# Patient Record
Sex: Male | Born: 1955 | Hispanic: No | Marital: Married | Smoking: Former smoker
Health system: Southern US, Community
[De-identification: ages and names within clinical notes are randomized; demographics above are authoritative.]

## PROBLEM LIST (undated history)

## (undated) DIAGNOSIS — F17200 Nicotine dependence, unspecified, uncomplicated: Secondary | ICD-10-CM

## (undated) DIAGNOSIS — R5381 Other malaise: Secondary | ICD-10-CM

## (undated) DIAGNOSIS — E291 Testicular hypofunction: Secondary | ICD-10-CM

## (undated) DIAGNOSIS — G4730 Sleep apnea, unspecified: Secondary | ICD-10-CM

## (undated) DIAGNOSIS — E785 Hyperlipidemia, unspecified: Secondary | ICD-10-CM

## (undated) DIAGNOSIS — J069 Acute upper respiratory infection, unspecified: Secondary | ICD-10-CM

## (undated) DIAGNOSIS — E079 Disorder of thyroid, unspecified: Secondary | ICD-10-CM

## (undated) DIAGNOSIS — Z8042 Family history of malignant neoplasm of prostate: Secondary | ICD-10-CM

## (undated) DIAGNOSIS — R5383 Other fatigue: Secondary | ICD-10-CM

## (undated) HISTORY — DX: Disorder of thyroid, unspecified: E07.9

## (undated) HISTORY — DX: Acute upper respiratory infection, unspecified: J06.9

## (undated) HISTORY — DX: Other malaise: R53.83

## (undated) HISTORY — DX: Other malaise: R53.81

## (undated) HISTORY — DX: Testicular hypofunction: E29.1

## (undated) HISTORY — PX: FINGER FRACTURE SURGERY: SHX638

## (undated) HISTORY — DX: Family history of malignant neoplasm of prostate: Z80.42

## (undated) HISTORY — DX: Nicotine dependence, unspecified, uncomplicated: F17.200

## (undated) HISTORY — PX: SHOULDER SURGERY: SHX246

## (undated) HISTORY — PX: KNEE SURGERY: SHX244

## (undated) HISTORY — DX: Sleep apnea, unspecified: G47.30

## (undated) HISTORY — DX: Hyperlipidemia, unspecified: E78.5

---

## 1998-08-21 ENCOUNTER — Other Ambulatory Visit: Admission: RE | Admit: 1998-08-21 | Discharge: 1998-08-21 | Payer: Self-pay | Admitting: Gastroenterology

## 2003-09-21 ENCOUNTER — Emergency Department (HOSPITAL_COMMUNITY): Admission: EM | Admit: 2003-09-21 | Discharge: 2003-09-21 | Payer: Self-pay | Admitting: Emergency Medicine

## 2004-08-24 ENCOUNTER — Ambulatory Visit: Payer: Self-pay | Admitting: Family Medicine

## 2004-09-02 ENCOUNTER — Ambulatory Visit: Payer: Self-pay | Admitting: Family Medicine

## 2004-10-11 ENCOUNTER — Ambulatory Visit: Payer: Self-pay | Admitting: Family Medicine

## 2005-04-29 ENCOUNTER — Ambulatory Visit: Payer: Self-pay | Admitting: Family Medicine

## 2005-05-13 ENCOUNTER — Ambulatory Visit: Payer: Self-pay | Admitting: Family Medicine

## 2005-10-14 ENCOUNTER — Ambulatory Visit: Payer: Self-pay | Admitting: Family Medicine

## 2005-11-15 ENCOUNTER — Ambulatory Visit: Payer: Self-pay | Admitting: Family Medicine

## 2006-01-09 ENCOUNTER — Ambulatory Visit: Payer: Self-pay | Admitting: Family Medicine

## 2006-08-29 ENCOUNTER — Encounter: Payer: Self-pay | Admitting: Family Medicine

## 2007-09-13 ENCOUNTER — Encounter: Payer: Self-pay | Admitting: Family Medicine

## 2007-10-15 ENCOUNTER — Ambulatory Visit: Payer: Self-pay | Admitting: Family Medicine

## 2007-10-15 DIAGNOSIS — J069 Acute upper respiratory infection, unspecified: Secondary | ICD-10-CM | POA: Insufficient documentation

## 2007-10-30 ENCOUNTER — Ambulatory Visit: Payer: Self-pay | Admitting: Family Medicine

## 2007-10-30 DIAGNOSIS — F17200 Nicotine dependence, unspecified, uncomplicated: Secondary | ICD-10-CM

## 2007-10-30 DIAGNOSIS — R5383 Other fatigue: Secondary | ICD-10-CM

## 2007-10-30 DIAGNOSIS — E785 Hyperlipidemia, unspecified: Secondary | ICD-10-CM | POA: Insufficient documentation

## 2007-10-30 DIAGNOSIS — R7989 Other specified abnormal findings of blood chemistry: Secondary | ICD-10-CM

## 2007-10-30 DIAGNOSIS — R5381 Other malaise: Secondary | ICD-10-CM

## 2007-10-31 ENCOUNTER — Ambulatory Visit: Payer: Self-pay | Admitting: Family Medicine

## 2007-10-31 LAB — CONVERTED CEMR LAB
Albumin: 4.3 g/dL (ref 3.5–5.2)
BUN: 17 mg/dL (ref 6–23)
CO2: 26 meq/L (ref 19–32)
Calcium: 9.1 mg/dL (ref 8.4–10.5)
Cholesterol: 205 mg/dL (ref 0–200)
Creatinine, Ser: 1.3 mg/dL (ref 0.4–1.5)
Direct LDL: 149.3 mg/dL
Glucose, Bld: 104 mg/dL — ABNORMAL HIGH (ref 70–99)
Total Bilirubin: 1.1 mg/dL (ref 0.3–1.2)
Total CHOL/HDL Ratio: 5
Total Protein: 6.7 g/dL (ref 6.0–8.3)

## 2007-11-08 ENCOUNTER — Ambulatory Visit: Payer: Self-pay | Admitting: Pulmonary Disease

## 2007-11-08 DIAGNOSIS — G4733 Obstructive sleep apnea (adult) (pediatric): Secondary | ICD-10-CM

## 2007-12-07 ENCOUNTER — Ambulatory Visit (HOSPITAL_BASED_OUTPATIENT_CLINIC_OR_DEPARTMENT_OTHER): Admission: RE | Admit: 2007-12-07 | Discharge: 2007-12-07 | Payer: Self-pay | Admitting: Pulmonary Disease

## 2007-12-07 ENCOUNTER — Encounter: Payer: Self-pay | Admitting: Pulmonary Disease

## 2007-12-14 ENCOUNTER — Ambulatory Visit: Payer: Self-pay | Admitting: Pulmonary Disease

## 2007-12-18 ENCOUNTER — Telehealth (INDEPENDENT_AMBULATORY_CARE_PROVIDER_SITE_OTHER): Payer: Self-pay | Admitting: *Deleted

## 2007-12-25 ENCOUNTER — Ambulatory Visit: Payer: Self-pay | Admitting: Pulmonary Disease

## 2008-01-22 ENCOUNTER — Ambulatory Visit: Payer: Self-pay | Admitting: Pulmonary Disease

## 2008-02-16 ENCOUNTER — Encounter: Payer: Self-pay | Admitting: Pulmonary Disease

## 2008-03-13 ENCOUNTER — Encounter: Payer: Self-pay | Admitting: Family Medicine

## 2008-08-13 ENCOUNTER — Ambulatory Visit (HOSPITAL_COMMUNITY): Admission: RE | Admit: 2008-08-13 | Discharge: 2008-08-13 | Payer: Self-pay | Admitting: Specialist

## 2008-10-09 ENCOUNTER — Encounter: Payer: Self-pay | Admitting: Family Medicine

## 2008-10-23 ENCOUNTER — Ambulatory Visit (HOSPITAL_COMMUNITY): Admission: RE | Admit: 2008-10-23 | Discharge: 2008-10-23 | Payer: Self-pay | Admitting: Specialist

## 2008-11-20 ENCOUNTER — Telehealth: Payer: Self-pay | Admitting: Pulmonary Disease

## 2010-09-07 NOTE — Procedures (Signed)
NAME:  Miguel Mendez, Miguel Mendez NO.:  [id number]   MEDICAL RECORD NO.:  [id number]          PATIENT TYPE:  OUT   LOCATION:  SLEEP CENTER                 FACILITY:  [facility]   PHYSICIAN:  Barbaraann Share, MD,FCCPDATE OF BIRTH:  06-Feb-1956   DATE OF STUDY:  12/07/2007                            NOCTURNAL POLYSOMNOGRAM   REFERRING PHYSICIAN:  Barbaraann Share, MD,FCCP   INDICATION FOR STUDY:  Hypersomnia or sleep apnea.   EPWORTH SLEEPINESS SCORE:  4.   SLEEP ARCHITECTURE:  The patient had a total sleep time of 317 minutes,  with no slow-wave sleep and mild decreased quantity of REM.  Sleep onset  latency was normal at 6.5 minutes, and REM onset was mildly prolonged at  117 minutes.  Sleep efficiency was decreased at 84%.   RESPIRATORY DATA:  The patient was found to have 36 apneas and 62  hypopneas, for an AHI of 19 events per hour.  He was also found to have  91 respiratory effort-related arousals, giving him a RDI of 36 events  per hour.  The events were worse in the supine position and during REM,  and moderate snoring was noted throughout.   OXYGEN DATA:  There was oxygen desaturation as low as 89% with the  patient's obstructive events.   CARDIAC DATA:  No clinically significant arrhythmias were noted.   MOVEMENT-PARASOMNIA:  The patient was found to have 277 periodic leg  movements, with 9 per hour resulting in arousal or awakening.   IMPRESSIONS-RECOMMENDATIONS:  1. 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  There was      oxygen desaturation as low as 89%.  Treatment for this degree of      sleep apnea can include weight loss alone, if applicable; upper      airway surgery, oral appliance and CPAP.  Clinical correlation is      suggested.  2. Very large numbers of periodic leg movements with significant sleep      disruption.  These may simply be due to      the patient's  sleep disordered breathing; however, I cannot exclude      a primary movement disorder of sleep.  Clinical correlation is      suggested.      Barbaraann Share, MD,FCCP  Diplomate, American Board of Sleep  Medicine  Electronically Signed     [facility]  D:  12/14/2007 18:18:09  T:  12/14/2007 20:37:14  Job:  [id number]

## 2010-11-03 ENCOUNTER — Encounter: Payer: Self-pay | Admitting: Family Medicine

## 2010-11-04 ENCOUNTER — Ambulatory Visit (INDEPENDENT_AMBULATORY_CARE_PROVIDER_SITE_OTHER): Payer: 59 | Admitting: Family Medicine

## 2010-11-04 ENCOUNTER — Telehealth: Payer: Self-pay | Admitting: *Deleted

## 2010-11-04 ENCOUNTER — Encounter: Payer: Self-pay | Admitting: Family Medicine

## 2010-11-04 VITALS — BP 122/76 | HR 74 | Temp 98.1°F | Ht 73.0 in | Wt 270.0 lb

## 2010-11-04 DIAGNOSIS — K117 Disturbances of salivary secretion: Secondary | ICD-10-CM

## 2010-11-04 DIAGNOSIS — R350 Frequency of micturition: Secondary | ICD-10-CM

## 2010-11-04 DIAGNOSIS — R682 Dry mouth, unspecified: Secondary | ICD-10-CM

## 2010-11-04 LAB — COMPREHENSIVE METABOLIC PANEL
Albumin: 4.7 g/dL (ref 3.5–5.2)
CO2: 25 mEq/L (ref 19–32)
Calcium: 8.8 mg/dL (ref 8.4–10.5)
GFR: 65.82 mL/min (ref >60.00)
Glucose, Bld: 99 mg/dL (ref 70–99)
Potassium: 4.1 mEq/L (ref 3.5–5.1)
Sodium: 142 mEq/L (ref 135–145)

## 2010-11-04 LAB — POCT URINALYSIS DIPSTICK
Glucose, UA: NEGATIVE
Ketones, UA: NEGATIVE
Leukocytes, UA: NEGATIVE

## 2010-11-04 NOTE — Telephone Encounter (Signed)
Patient was seen this morning and after talking with his urologist he was told to call and ask if a PSA could be added to the labs that were drawn this morning to prevent him from having to have his blood drawn again.

## 2010-11-04 NOTE — Telephone Encounter (Signed)
Patient was advised that we would not be able to add a PSA because the tube needed for the test was not drawn at visit this am.

## 2010-11-04 NOTE — Progress Notes (Signed)
Off testosterone.  Tired.  Thirsty and dry mouth.  Nocturia, frequency less of a problem in the day.  Mood changes (off testosterone ~8 months)- less patience than normal.  Losing weight, down about 20 lbs over 1 year but recently leveled off.  Shaky if he doesn't eat, better after a meal, going on for about 1 year.  No SI/HI.  No burning with urination.    No FCNAVD, no rash.  Ate breakfast but not lunch.    Meds, vitals, and allergies reviewed.   ROS: See HPI.  Otherwise, noncontributory.  GEN: nad, alert and oriented HEENT: mucous membranes minimally dry, tm wnl, nasal and op exam wnl o/w.  NECK: supple w/o LA CV: rrr PULM: ctab, no inc wob ABD: soft, +bs EXT: trace edema SKIN: no acute rash

## 2010-11-04 NOTE — Patient Instructions (Signed)
You can get your results through our phone system.  Follow the instructions on the blue card. Call the urology clinic about your testosterone treatment.   Take care.

## 2012-05-16 ENCOUNTER — Encounter (HOSPITAL_COMMUNITY): Payer: Self-pay | Admitting: Emergency Medicine

## 2012-05-16 ENCOUNTER — Emergency Department (HOSPITAL_COMMUNITY)
Admission: EM | Admit: 2012-05-16 | Discharge: 2012-05-16 | Disposition: A | Discharge status: Home / Self Care | Payer: 59 | Attending: Emergency Medicine | Admitting: Emergency Medicine

## 2012-05-16 ENCOUNTER — Emergency Department (HOSPITAL_COMMUNITY): Payer: 59

## 2012-05-16 DIAGNOSIS — Z79899 Other long term (current) drug therapy: Secondary | ICD-10-CM | POA: Insufficient documentation

## 2012-05-16 DIAGNOSIS — R110 Nausea: Secondary | ICD-10-CM | POA: Insufficient documentation

## 2012-05-16 DIAGNOSIS — Z8639 Personal history of other endocrine, nutritional and metabolic disease: Secondary | ICD-10-CM | POA: Insufficient documentation

## 2012-05-16 DIAGNOSIS — N201 Calculus of ureter: Secondary | ICD-10-CM

## 2012-05-16 DIAGNOSIS — Z862 Personal history of diseases of the blood and blood-forming organs and certain disorders involving the immune mechanism: Secondary | ICD-10-CM | POA: Insufficient documentation

## 2012-05-16 LAB — URINALYSIS, ROUTINE W REFLEX MICROSCOPIC
Bilirubin Urine: NEGATIVE
Ketones, ur: 15 mg/dL — AB
Nitrite: NEGATIVE
Specific Gravity, Urine: 1.03 (ref 1.005–1.030)
Urobilinogen, UA: 0.2 mg/dL (ref 0.0–1.0)
pH: 6 (ref 5.0–8.0)

## 2012-05-16 LAB — URINE MICROSCOPIC-ADD ON

## 2012-05-16 LAB — CBC WITH DIFFERENTIAL/PLATELET
Basophils Relative: 1 % (ref 0–1)
HCT: 43.4 % (ref 39.0–52.0)
Hemoglobin: 15 g/dL (ref 13.0–17.0)
Lymphs Abs: 1.8 10*3/uL (ref 0.7–4.0)
MCH: 32.9 pg (ref 26.0–34.0)
MCHC: 34.6 g/dL (ref 30.0–36.0)
Monocytes Absolute: 0.6 10*3/uL (ref 0.1–1.0)
Monocytes Relative: 9 % (ref 3–12)
Neutro Abs: 4.5 10*3/uL (ref 1.7–7.7)
RBC: 4.56 MIL/uL (ref 4.22–5.81)
WBC: 7.1 10*3/uL (ref 4.0–10.5)

## 2012-05-16 LAB — BASIC METABOLIC PANEL
CO2: 24 mEq/L (ref 19–32)
Calcium: 9.4 mg/dL (ref 8.4–10.5)
GFR calc Af Amer: 75 mL/min — ABNORMAL LOW (ref >90)
GFR calc non Af Amer: 65 mL/min — ABNORMAL LOW (ref >90)

## 2012-05-16 MED ORDER — SODIUM CHLORIDE 0.9 % IV SOLN
Freq: Once | INTRAVENOUS | Status: AC
  Administered 2012-05-16: 08:00:00 via INTRAVENOUS

## 2012-05-16 MED ORDER — ONDANSETRON HCL 4 MG/2ML IJ SOLN
4.0000 mg | Freq: Once | INTRAMUSCULAR | Status: AC
  Administered 2012-05-16: 4 mg via INTRAVENOUS
  Filled 2012-05-16: qty 2

## 2012-05-16 MED ORDER — OXYCODONE-ACETAMINOPHEN 5-325 MG PO TABS: 1.0000 | ORAL_TABLET | Freq: Four times a day (QID) | ORAL | Status: DC | PRN

## 2012-05-16 MED ORDER — FENTANYL CITRATE 0.05 MG/ML IJ SOLN
100.0000 ug | Freq: Once | INTRAMUSCULAR | Status: AC
  Administered 2012-05-16: 100 ug via INTRAVENOUS
  Filled 2012-05-16: qty 2

## 2012-05-16 MED ORDER — KETOROLAC TROMETHAMINE 30 MG/ML IJ SOLN
30.0000 mg | Freq: Once | INTRAMUSCULAR | Status: AC
  Administered 2012-05-16: 30 mg via INTRAVENOUS
  Filled 2012-05-16: qty 1

## 2012-05-16 MED ORDER — ONDANSETRON 8 MG PO TBDP: 8.0000 mg | ORAL_TABLET | Freq: Three times a day (TID) | ORAL | Status: DC | PRN

## 2012-05-16 MED ORDER — TAMSULOSIN HCL 0.4 MG PO CAPS: 0.4000 mg | ORAL_CAPSULE | Freq: Every day | ORAL | Status: DC

## 2012-05-16 NOTE — ED Provider Notes (Signed)
History     CSN: [id number]  Arrival date & time 05/16/12  0746   First MD Initiated Contact with Patient 05/16/12 0800      Chief Complaint  Patient presents with  . Flank Pain    (Consider location/radiation/quality/duration/timing/severity/associated sxs/prior treatment) Patient is a 58 y.o. male presenting with flank pain. The history is provided by the patient.  Flank Pain This is a recurrent problem. Pertinent negatives include no chest pain, no abdominal pain, no headaches and no shortness of breath.   patient developed right flank and abdominal pain this morning. It is sharp. It feels somewhat like his previous kidney stones. No dysuria. No constipation or diarrhea. No relief with bowel movement this morning. He's had nausea without vomiting. No fevers. His last kidney stone was approximately 2004. The pain is constant, but does wax and wane.  Past Medical History  Diagnosis Date  . Hyperlipidemia   . Tobacco use disorder   . Family history of malignant neoplasm of prostate   . Other testicular hypofunction   . Other malaise and fatigue   . Acute upper respiratory infections of unspecified site     History reviewed. No pertinent past surgical history.  Family History  Problem Relation Age of Onset  . Prostate cancer Father   . Coronary artery disease Father   . Prostate cancer Brother   . Coronary artery disease Maternal Grandfather   . Coronary artery disease Paternal Grandfather     in old age  . Diabetes Cousin   . Diabetes Son     History  Substance Use Topics  . Smoking status: Passive Smoke Exposure - Never Smoker    Types: Cigarettes  . Smokeless tobacco: Not on file     Comment: occasional- 1 cig several days per week  . Alcohol Use: Yes     Comment: Rare      Review of Systems  Constitutional: Negative for activity change and appetite change.  HENT: Negative for neck stiffness.   Eyes: Negative for pain.  Respiratory: Negative for chest  tightness and shortness of breath.   Cardiovascular: Negative for chest pain and leg swelling.  Gastrointestinal: Positive for nausea. Negative for vomiting, abdominal pain and diarrhea.  Genitourinary: Positive for flank pain.  Musculoskeletal: Negative for back pain.  Skin: Negative for rash.  Neurological: Negative for weakness, numbness and headaches.  Psychiatric/Behavioral: Negative for behavioral problems.    Allergies  Bee venom  Home Medications   Current Outpatient Rx  Name  Route  Sig  Dispense  Refill  . ONE-DAILY MULTI VITAMINS PO TABS   Oral   Take 1 tablet by mouth daily.           Marland Kitchen NAPROXEN SODIUM 220 MG PO TABS   Oral   Take 220 mg by mouth 2 (two) times daily as needed. For pain         . NON FORMULARY      CPAP          . ONDANSETRON 8 MG PO TBDP   Oral   Take 1 tablet (8 mg total) by mouth every 8 (eight) hours as needed for nausea.   20 tablet   0   . OXYCODONE-ACETAMINOPHEN 5-325 MG PO TABS   Oral   Take 1-2 tablets by mouth every 6 (six) hours as needed for pain.   20 tablet   0   . TAMSULOSIN HCL 0.4 MG PO CAPS   Oral   Take 1 capsule (  0.4 mg total) by mouth daily.   7 capsule   0     BP 126/82  Pulse 87  Temp 97.6 F (36.4 C) (Oral)  Resp 16  SpO2 100%  Physical Exam  Nursing note and vitals reviewed. Constitutional: He is oriented to person, place, and time. He appears well-developed and well-nourished.  HENT:  Head: Normocephalic and atraumatic.  Eyes: EOM are normal. Pupils are equal, round, and reactive to light.  Neck: Normal range of motion. Neck supple.  Cardiovascular: Normal rate, regular rhythm and normal heart sounds.   No murmur heard. Pulmonary/Chest: Effort normal and breath sounds normal.  Abdominal: Soft. Bowel sounds are normal. He exhibits no distension and no mass. There is no tenderness. There is no rebound and no guarding.  Genitourinary:       Mild tenderness to right CVA area.  Musculoskeletal:  Normal range of motion. He exhibits no edema.  Neurological: He is alert and oriented to person, place, and time. No cranial nerve deficit.  Skin: Skin is warm and dry.  Psychiatric: He has a normal mood and affect.    ED Course  Procedures (including critical care time)  Labs Reviewed  URINALYSIS, ROUTINE W REFLEX MICROSCOPIC - Abnormal; Notable for the following:    Color, Urine AMBER (*)  BIOCHEMICALS MAY BE AFFECTED BY COLOR   APPearance CLOUDY (*)     Hgb urine dipstick LARGE (*)     Ketones, ur 15 (*)     Protein, ur 30 (*)     All other components within normal limits  BASIC METABOLIC PANEL - Abnormal; Notable for the following:    Potassium 3.2 (*)     Glucose, Bld 119 (*)     GFR calc non Af Amer 65 (*)     GFR calc Af Amer 75 (*)     All other components within normal limits  CBC WITH DIFFERENTIAL  URINE MICROSCOPIC-ADD ON   Ct Abdomen Pelvis Wo Contrast  05/16/2012  *RADIOLOGY REPORT*  Clinical Data: Right flank pain  CT ABDOMEN AND PELVIS WITHOUT CONTRAST  Technique:  Multidetector CT imaging of the abdomen and pelvis was performed following the standard protocol without intravenous contrast.  Comparison: 09/21/2003  Findings: Lung bases are unremarkable.  Sagittal images of the spine shows degenerative changes lumbar spine at L1-L2 level.  Unenhanced liver shows no biliary ductal dilatation.  No calcified gallstones are noted within gallbladder.  Unenhanced pancreas, spleen and adrenal glands are unremarkable.  There is mild right hydronephrosis and minimal proximal right hydroureter.  Mild right perinephric stranding.  In axial image 38 there is 3 mm calcified calculus in proximal right ureter at the level of the upper endplate of the L3 vertebral body.  No diagnostic renal calculi are noted.  No left ureteral calculi. No aortic aneurysm.  No small bowel obstruction.  No ascites or free air.  No adenopathy.  Normal appendix partially visualized in axial image 53.  No  pericecal inflammation.  Scattered colonic diverticula left colon without evidence of acute diverticulitis. There is fluid in the lower right inguinal scrotal canal.  No calcified calculi are noted within the urinary bladder.  The urinary bladder is underdistended.  Prostate gland and  seminal vesicles are unremarkable.  No destructive bony lesions are noted within pelvis.  IMPRESSION:  1.  There is mild right hydronephrosis and minimal proximal right hydroureter.  Mild right perinephric stranding. 2.  There is 3 mm calcified calculus in proximal right ureter at  the level of the upper endplate of the L3 vertebral body. 3.  Normal appendix.  No pericecal inflammation. 4.  Scattered colonic diverticula distal colon without evidence of acute diverticulitis.   Original Report Authenticated By: Natasha Mead, M.D.      1. Right ureteral stone       MDM  Patient flank pain. 3 mm proximal ureteral stone. No infection. Pain has been improved and patient be discharged to follow with urology        Juliet Rude. Rubin Payor, MD 05/16/12 [phone]

## 2012-05-16 NOTE — ED Notes (Signed)
Right flank pain started at 7:15 this am-- hx kidney stone in 2004, feels nauseated.

## 2012-10-30 ENCOUNTER — Encounter: Payer: Self-pay | Admitting: Family Medicine

## 2012-10-30 ENCOUNTER — Ambulatory Visit (INDEPENDENT_AMBULATORY_CARE_PROVIDER_SITE_OTHER): Payer: 59 | Admitting: Family Medicine

## 2012-10-30 ENCOUNTER — Telehealth: Payer: Self-pay | Admitting: *Deleted

## 2012-10-30 ENCOUNTER — Ambulatory Visit: Payer: Self-pay | Admitting: Family Medicine

## 2012-10-30 VITALS — BP 116/82 | HR 88 | Temp 98.6°F | Ht 73.0 in | Wt 254.2 lb

## 2012-10-30 DIAGNOSIS — M7989 Other specified soft tissue disorders: Secondary | ICD-10-CM

## 2012-10-30 DIAGNOSIS — F17200 Nicotine dependence, unspecified, uncomplicated: Secondary | ICD-10-CM

## 2012-10-30 DIAGNOSIS — Z8042 Family history of malignant neoplasm of prostate: Secondary | ICD-10-CM | POA: Insufficient documentation

## 2012-10-30 DIAGNOSIS — M25561 Pain in right knee: Secondary | ICD-10-CM

## 2012-10-30 NOTE — Patient Instructions (Addendum)
Stop up front and we will schedule an ultrasound of your leg today- then will call you with a result and a plan At any time if you develop shortness of breath or chest pain - let us know or go to ER

## 2012-10-30 NOTE — Addendum Note (Signed)
Addended by: Roxy Manns A on: 10/30/2012 05:02 PM   Modules accepted: Orders

## 2012-10-30 NOTE — Assessment & Plan Note (Signed)
Disc in detail risks of smoking and possible outcomes including copd, vascular/ heart disease, cancer , respiratory and sinus infections  Pt voices understanding Pt is not ready to quit at this time  

## 2012-10-30 NOTE — Telephone Encounter (Signed)
Pt notified of [place] results and agrees with referral, I advise pt that Shirlee Limerick will call to set up appt

## 2012-10-30 NOTE — Assessment & Plan Note (Signed)
Need to r/o DVT or other clot in pt who smokes  Sent for venous duplex exam and then make plan  Of note- also having some anterior knee pain that could contribute

## 2012-10-30 NOTE — Progress Notes (Signed)
Subjective:    Patient ID: Miguel Mendez, male    DOB: 30-Sep-1955, 57 y.o.   MRN: [id number]  HPI 2 week history of swelling of R leg below the knee  Was worse early this month- could not see ankle well  Some pain in calf muscle posteriorly and also in his knee  No redness or knot , and no hx of varicose veins   No personal hx of blood clot - and does not think that runs in his family   Very little swelling in other leg   Still smokes occasionally   Continues f/u with urology for screen for prostate ca yearly   Smokes cigarettes - sometimes 1 per day / occ not - quit for 20 years in the past  If he had blood clot would consider quitting  No breathing problems   Patient Active Problem List   Diagnosis Date Noted  . Right leg swelling 10/30/2012  . Dry mouth 11/04/2010  . OBSTRUCTIVE SLEEP APNEA 11/08/2007  . HYPOGONADISM 10/30/2007  . SMOKER 10/30/2007  . Other Malaise and Fatigue 10/30/2007  . HYPERLIPIDEMIA, MILD, HX OF 10/30/2007   Past Medical History  Diagnosis Date  . Hyperlipidemia   . Tobacco use disorder   . Family history of malignant neoplasm of prostate   . Other testicular hypofunction   . Other malaise and fatigue   . Acute upper respiratory infections of unspecified site    No past surgical history on file. History  Substance Use Topics  . Smoking status: Current Some Day Smoker    Types: Cigars  . Smokeless tobacco: Not on file     Comment: occasional- 1 cig several days per week  . Alcohol Use: Yes     Comment: Rare   Family History  Problem Relation Age of Onset  . Prostate cancer Father   . Coronary artery disease Father   . Prostate cancer Brother   . Coronary artery disease Maternal Grandfather   . Coronary artery disease Paternal Grandfather     in old age  . Diabetes Cousin   . Diabetes Son    Allergies  Allergen Reactions  . Bee Venom Swelling   Current Outpatient Prescriptions on File Prior to Visit  Medication Sig Dispense  Refill  . Multiple Vitamin (MULTIVITAMIN) tablet Take 1 tablet by mouth daily.        . NON FORMULARY CPAP        No current facility-administered medications on file prior to visit.    Review of Systems Review of Systems  Constitutional: Negative for fever, appetite change, fatigue and unexpected weight change.  Eyes: Negative for pain and visual disturbance.  Respiratory: Negative for cough and shortness of breath.   Cardiovascular: Negative for cp or palpitations    Gastrointestinal: Negative for nausea, diarrhea and constipation.  Genitourinary: Negative for urgency and frequency.  Skin: Negative for pallor or rash   MSK pos for R lower leg swelling with calf soreness, neg for joint swelling or redness Neurological: Negative for weakness, light-headedness, numbness and headaches.  Hematological: Negative for adenopathy. Does not bruise/bleed easily.  Psychiatric/Behavioral: Negative for dysphoric mood. The patient is not nervous/anxious.         Objective:   Physical Exam  Constitutional: He appears well-developed and well-nourished. No distress.  overwt and well appearing   HENT:  Head: Normocephalic and atraumatic.  Eyes: Conjunctivae and EOM are normal. Pupils are equal, round, and reactive to light.  Neck: Neck supple. No  JVD present. Carotid bruit is not present. No thyromegaly present.  Cardiovascular: Normal rate and regular rhythm.  Exam reveals no gallop.   No murmur heard. One plus pedal pulses bilat  Pulmonary/Chest: Effort normal and breath sounds normal. No respiratory distress. He has no wheezes. He has no rales.  No crackles  Abdominal: Soft. Bowel sounds are normal.  Musculoskeletal: He exhibits edema and tenderness.  One plus pitting edema from knee to ankle with sock line Tenderness posterior calf without heat/redness or palp cord Pos homans' sign   Neurological: He is alert.  Skin: Skin is warm and dry. No rash noted. No erythema. No pallor.   Psychiatric: He has a normal mood and affect.          Assessment & Plan:

## 2012-10-30 NOTE — Telephone Encounter (Signed)
Thanks - let pt know no clot- good news! There is some fluid in the area and I suspect he may have had a ruptured cyst in the back of his knee - I would like to refer him to orthopedics to look at this please if agreeable  In the meantime elevate leg when you can

## 2012-10-30 NOTE — Telephone Encounter (Signed)
Miguel Mendez with [facility] ultrasound dpt called with a call report on pt.  Pt is Neg for DVT in Right leg. There was a fluid collection in his calf the largest collection was 7.8 cm, ultrasound tech wasn't sure if it was a hematoma or a ruptured bakers cyst, since there was no DVT pt was instructed to leave and we would call him with the results

## 2012-10-30 NOTE — Telephone Encounter (Signed)
Ref done  

## 2012-10-31 ENCOUNTER — Encounter: Payer: Self-pay | Admitting: Family Medicine

## 2013-02-28 ENCOUNTER — Other Ambulatory Visit: Payer: Self-pay

## 2013-10-10 ENCOUNTER — Ambulatory Visit (INDEPENDENT_AMBULATORY_CARE_PROVIDER_SITE_OTHER): Payer: 59 | Admitting: Internal Medicine

## 2013-10-10 ENCOUNTER — Encounter: Payer: Self-pay | Admitting: Internal Medicine

## 2013-10-10 VITALS — BP 118/76 | HR 88 | Temp 97.9°F | Wt 258.5 lb

## 2013-10-10 DIAGNOSIS — R209 Unspecified disturbances of skin sensation: Secondary | ICD-10-CM

## 2013-10-10 DIAGNOSIS — R220 Localized swelling, mass and lump, head: Secondary | ICD-10-CM

## 2013-10-10 DIAGNOSIS — R221 Localized swelling, mass and lump, neck: Secondary | ICD-10-CM

## 2013-10-10 DIAGNOSIS — R202 Paresthesia of skin: Secondary | ICD-10-CM

## 2013-10-10 LAB — COMPREHENSIVE METABOLIC PANEL
ALK PHOS: 50 U/L (ref 39–117)
ALT: 28 U/L (ref 0–53)
AST: 26 U/L (ref 0–37)
Albumin: 4.6 g/dL (ref 3.5–5.2)
BILIRUBIN TOTAL: 0.7 mg/dL (ref 0.2–1.2)
BUN: 12 mg/dL (ref 6–23)
CO2: 30 meq/L (ref 19–32)
Calcium: 9.6 mg/dL (ref 8.4–10.5)
Chloride: 106 mEq/L (ref 96–112)
Creatinine, Ser: 1.2 mg/dL (ref 0.4–1.5)
GFR: 64.51 mL/min (ref >60.00)
GLUCOSE: 104 mg/dL — AB (ref 70–99)
POTASSIUM: 4.4 meq/L (ref 3.5–5.1)
Sodium: 140 mEq/L (ref 135–145)
TOTAL PROTEIN: 7.2 g/dL (ref 6.0–8.3)

## 2013-10-10 LAB — TSH: TSH: 13.55 u[IU]/mL — ABNORMAL HIGH (ref 0.35–4.50)

## 2013-10-10 LAB — CBC
HCT: 44.7 % (ref 39.0–52.0)
Hemoglobin: 15.1 g/dL (ref 13.0–17.0)
MCHC: 33.8 g/dL (ref 30.0–36.0)
MCV: 94.2 fl (ref 78.0–100.0)
Platelets: 201 10*3/uL (ref 150.0–400.0)
RBC: 4.74 Mil/uL (ref 4.22–5.81)
RDW: 13.3 % (ref 11.5–15.5)
WBC: 7.1 10*3/uL (ref 4.0–10.5)

## 2013-10-10 LAB — VITAMIN B12: Vitamin B-12: 427 pg/mL (ref 211–911)

## 2013-10-10 NOTE — Progress Notes (Signed)
Subjective:    Patient ID: Miguel Mendez, male    DOB: 12/13/1955, 58 y.o.   MRN: [id number]  HPI  Pt presents to the clinic today with c/o a swelling on the front of his neck. He noticed this 1 week ago. He denies neck pain, difficulty swallowing, fever, chills or body aches. He has not had any particular injury to the area.  Additionally, he reports that he notices that his fingers turn white and start to cramp. This occurs mainly in the index finger of his right hand but also his left hand as well. It seems to be worse when it is cold outside. He has noticed some numbness and tingling as well. He denies pain or stiffness in the joints. He has not tried anything OTC.  Review of Systems      Past Medical History  Diagnosis Date  . Hyperlipidemia   . Tobacco use disorder   . Family history of malignant neoplasm of prostate   . Other testicular hypofunction   . Other malaise and fatigue   . Acute upper respiratory infections of unspecified site     Current Outpatient Prescriptions  Medication Sig Dispense Refill  . ibuprofen (ADVIL,MOTRIN) 200 MG tablet Take 200 mg by mouth as needed for pain.      . Multiple Vitamin (MULTIVITAMIN) tablet Take 1 tablet by mouth daily.        . NON FORMULARY CPAP        No current facility-administered medications for this visit.    Allergies  Allergen Reactions  . Bee Venom Swelling    Family History  Problem Relation Age of Onset  . Prostate cancer Father   . Coronary artery disease Father   . Prostate cancer Brother   . Coronary artery disease Maternal Grandfather   . Coronary artery disease Paternal Grandfather     in old age  . Diabetes Cousin   . Diabetes Son     History   Social History  . Marital Status: Married    Spouse Name: N/A    Number of Children: N/A  . Years of Education: N/A   Occupational History  . Construction    Social History Main Topics  . Smoking status: Former Smoker    Types: Cigars  .  Smokeless tobacco: Not on file     Comment: quit 03/2013  . Alcohol Use: Yes     Comment: Rare  . Drug Use: No  . Sexual Activity: Not on file   Other Topics Concern  . Not on file   Social History Narrative   Works at [employer] center.        Married; 1 child     Constitutional: Pt reports fatigue. Denies fever, fatigue, headache or abrupt weight changes.  Musculoskeletal: Pt reports cramping of the hands. Denies decrease in range of motion, difficulty with gait, muscle pain or joint pain and swelling.  Skin: Pt reports cyst on neck. Denies redness, rashes, or ulcercations.    No other specific complaints in a complete review of systems (except as listed in HPI above).  Objective:   Physical Exam   BP 118/76  Pulse 88  Temp(Src) 97.9 F (36.6 C) (Oral)  Wt 258 lb 8 oz (117.255 kg)  SpO2 98% Wt Readings from Last 3 Encounters:  10/10/13 258 lb 8 oz (117.255 kg)  10/30/12 254 lb 4 oz (115.327 kg)  11/04/10 270 lb (122.471 kg)    General: Appears his stated  age, well developed, well nourished in NAD. Skin: Warm, dry and intact. Noticeable swelling of the left superior neck adjacent to midline. No pain with palpation. Neck: Normal range of motion. Neck supple, trachea midline. No massses, lumps or thyromegaly present.  Cardiovascular: Normal rate and rhythm. S1,S2 noted.  No murmur, rubs or gallops noted. No JVD or BLE edema. No carotid bruits noted. Cap refill < 3 secs and 2+ radial pulses bilaterally. Pulmonary/Chest: Normal effort and positive vesicular breath sounds. No respiratory distress. No wheezes, rales or ronchi noted.  Musculoskeletal: Normal flexion, extension of wrist and fingers bilaterally. 5/5 hand grip bilaterally.    BMET    Component Value Date/Time   NA 144 05/16/2012 0802   K 3.2* 05/16/2012 0802   CL 107 05/16/2012 0802   CO2 24 05/16/2012 0802   GLUCOSE 119* 05/16/2012 0802   BUN 16 05/16/2012 0802   CREATININE 1.22 05/16/2012  0802   CALCIUM 9.4 05/16/2012 0802   GFRNONAA 65* 05/16/2012 0802   GFRAA 75* 05/16/2012 0802    Lipid Panel     Component Value Date/Time   CHOL 205* 10/31/2007 0951   TRIG 91 10/31/2007 0951   HDL 40.8 10/31/2007 0951   CHOLHDL 5.0 CALC 10/31/2007 0951   VLDL 18 10/31/2007 0951    CBC    Component Value Date/Time   WBC 7.1 05/16/2012 0802   RBC 4.56 05/16/2012 0802   HGB 15.0 05/16/2012 0802   HCT 43.4 05/16/2012 0802   PLT 156 05/16/2012 0802   MCV 95.2 05/16/2012 0802   MCH 32.9 05/16/2012 0802   MCHC 34.6 05/16/2012 0802   RDW 12.5 05/16/2012 0802   LYMPHSABS 1.8 05/16/2012 0802   MONOABS 0.6 05/16/2012 0802   EOSABS 0.1 05/16/2012 0802   BASOSABS 0.1 05/16/2012 0802    Hgb A1C No results found for this basename: HGBA1C        Assessment & Plan:   Swelling/mass of left neck:  Will obtain ultrasound to identify the swelling/mass  Paresthesia/Cramps of hands:  Doesn't appear to be arthritis Will start with CBC, CMET, TSH and B12  Will follow up after labs/imaging are back, RTC as needed

## 2013-10-10 NOTE — Patient Instructions (Signed)
Neck swelling

## 2013-10-10 NOTE — Progress Notes (Signed)
Pre visit review using our clinic review tool, if applicable. No additional management support is needed unless otherwise documented below in the visit note. 

## 2013-10-11 ENCOUNTER — Encounter: Payer: Self-pay | Admitting: Internal Medicine

## 2013-10-11 ENCOUNTER — Ambulatory Visit: Payer: Self-pay | Admitting: Internal Medicine

## 2013-10-11 MED ORDER — LEVOTHYROXINE SODIUM 25 MCG PO TABS: 25.0000 ug | ORAL_TABLET | Freq: Every day | ORAL | Status: DC

## 2013-10-11 NOTE — Addendum Note (Signed)
Addended by: Lurlean Nanny on: 10/11/2013 05:04 PM   Modules accepted: Orders

## 2013-10-18 ENCOUNTER — Ambulatory Visit (HOSPITAL_COMMUNITY)
Admission: RE | Admit: 2013-10-18 | Discharge: 2013-10-18 | Disposition: A | Discharge status: Home / Self Care | Payer: Worker's Compensation | Source: Ambulatory Visit | Attending: Specialist | Admitting: Specialist

## 2013-10-18 ENCOUNTER — Other Ambulatory Visit (HOSPITAL_COMMUNITY): Payer: Self-pay | Admitting: Specialist

## 2013-10-18 DIAGNOSIS — M25511 Pain in right shoulder: Secondary | ICD-10-CM

## 2013-10-18 DIAGNOSIS — Z1389 Encounter for screening for other disorder: Secondary | ICD-10-CM | POA: Insufficient documentation

## 2013-11-09 ENCOUNTER — Other Ambulatory Visit: Payer: Self-pay | Admitting: Internal Medicine

## 2013-11-21 ENCOUNTER — Ambulatory Visit (INDEPENDENT_AMBULATORY_CARE_PROVIDER_SITE_OTHER): Payer: 59 | Admitting: Internal Medicine

## 2013-11-21 ENCOUNTER — Encounter: Payer: Self-pay | Admitting: Internal Medicine

## 2013-11-21 VITALS — BP 122/76 | HR 90 | Temp 98.1°F | Wt 259.0 lb

## 2013-11-21 DIAGNOSIS — R5381 Other malaise: Secondary | ICD-10-CM

## 2013-11-21 DIAGNOSIS — R5383 Other fatigue: Secondary | ICD-10-CM

## 2013-11-21 DIAGNOSIS — R221 Localized swelling, mass and lump, neck: Secondary | ICD-10-CM

## 2013-11-21 DIAGNOSIS — E039 Hypothyroidism, unspecified: Secondary | ICD-10-CM

## 2013-11-21 DIAGNOSIS — R220 Localized swelling, mass and lump, head: Secondary | ICD-10-CM

## 2013-11-21 LAB — TSH: TSH: 8.18 u[IU]/mL — ABNORMAL HIGH (ref 0.35–4.50)

## 2013-11-21 LAB — T4, FREE: Free T4: 0.78 ng/dL (ref 0.60–1.60)

## 2013-11-21 NOTE — Patient Instructions (Signed)
Hypothyroidism The thyroid is a large gland located in the lower front of your neck. The thyroid gland helps control metabolism. Metabolism is how your body handles food. It controls metabolism with the hormone thyroxine. When this gland is underactive (hypothyroid), it produces too little hormone.  CAUSES These include:   Absence or destruction of thyroid tissue.  Goiter due to iodine deficiency.  Goiter due to medications.  Congenital defects (since birth).  Problems with the pituitary. This causes a lack of TSH (thyroid stimulating hormone). This hormone tells the thyroid to turn out more hormone. SYMPTOMS  Lethargy (feeling as though you have no energy)  Cold intolerance  Weight gain (in spite of normal food intake)  Dry skin  Coarse hair  Menstrual irregularity (if severe, may lead to infertility)  Slowing of thought processes Cardiac problems are also caused by insufficient amounts of thyroid hormone. Hypothyroidism in the newborn is cretinism, and is an extreme form. It is important that this form be treated adequately and immediately or it will lead rapidly to retarded physical and mental development. DIAGNOSIS  To prove hypothyroidism, your caregiver may do blood tests and ultrasound tests. Sometimes the signs are hidden. It may be necessary for your caregiver to watch this illness with blood tests either before or after diagnosis and treatment. TREATMENT  Low levels of thyroid hormone are increased by using synthetic thyroid hormone. This is a safe, effective treatment. It usually takes about four weeks to gain the full effects of the medication. After you have the full effect of the medication, it will generally take another four weeks for problems to leave. Your caregiver may start you on low doses. If you have had heart problems the dose may be gradually increased. It is generally not an emergency to get rapidly to normal. HOME CARE INSTRUCTIONS   Take your  medications as your caregiver suggests. Let your caregiver know of any medications you are taking or start taking. Your caregiver will help you with dosage schedules.  As your condition improves, your dosage needs may increase. It will be necessary to have continuing blood tests as suggested by your caregiver.  Report all suspected medication side effects to your caregiver. SEEK MEDICAL CARE IF: Seek medical care if you develop:  Sweating.  Tremulousness (tremors).  Anxiety.  Rapid weight loss.  Heat intolerance.  Emotional swings.  Diarrhea.  Weakness. SEEK IMMEDIATE MEDICAL CARE IF:  You develop chest pain, an irregular heart beat (palpitations), or a rapid heart beat. MAKE SURE YOU:   Understand these instructions.  Will watch your condition.  Will get help right away if you are not doing well or get worse. Document Released: 04/11/2005 Document Revised: 07/04/2011 Document Reviewed: 11/30/2007 [employer] Patient Information 2015 [employer]. This information is not intended to replace advice given to you by your health care provider. Make sure you discuss any questions you have with your health care provider.  

## 2013-11-21 NOTE — Progress Notes (Signed)
Pre visit review using our clinic review tool, if applicable. No additional management support is needed unless otherwise documented below in the visit note. 

## 2013-11-21 NOTE — Progress Notes (Signed)
Subjective:    Patient ID: Miguel Mendez, male    DOB: [birth year], 57 y.o.   MRN: [id number]  HPI  Pt presents to the clinic today to follow up abnormal TSH. His blood work 10/10/13 revealed a TSH of 13. At that time, he was started on synthroid. He is tolerating the medication well without side effects. He does still c/o of some fatigue.  He returns today to recheck the levels.  Of note, he did present to the last visit with a lump on his neck. With elevated TSH, there was concern for possible thyroid nodule. Ultrasound of the neck was normal. He reports that his wife still notices the swelling.   Review of Systems   Past Medical History  Diagnosis Date  . Hyperlipidemia   . Tobacco use disorder   . Family history of malignant neoplasm of prostate   . Other testicular hypofunction   . Other malaise and fatigue   . Acute upper respiratory infections of unspecified site     Current Outpatient Prescriptions  Medication Sig Dispense Refill  . ibuprofen (ADVIL,MOTRIN) 200 MG tablet Take 200 mg by mouth as needed for pain.      Marland Kitchen levothyroxine (SYNTHROID, LEVOTHROID) 25 MCG tablet Take 1 tablet (25 mcg total) by mouth daily before breakfast.  30 tablet  1  . Multiple Vitamin (MULTIVITAMIN) tablet Take 1 tablet by mouth daily.        . NON FORMULARY CPAP        No current facility-administered medications for this visit.    Allergies  Allergen Reactions  . Bee Venom Swelling    Family History  Problem Relation Age of Onset  . Prostate cancer Father   . Coronary artery disease Father   . Prostate cancer Brother   . Coronary artery disease Maternal Grandfather   . Coronary artery disease Paternal Grandfather     in old age  . Diabetes Cousin   . Diabetes Son     History   Social History  . Marital Status: Married    Spouse Name: N/A    Number of Children: N/A  . Years of Education: N/A   Occupational History  . Construction    Social History Main Topics  .  Smoking status: Former Smoker    Types: Cigars  . Smokeless tobacco: Not on file     Comment: quit 03/2013  . Alcohol Use: Yes     Comment: Rare  . Drug Use: No  . Sexual Activity: Not on file   Other Topics Concern  . Not on file   Social History Narrative   Works at [employer].        Married; 1 child     Constitutional: Pt reports fatigue. Denies fever, malaise, headache or abrupt weight changes.  Respiratory: Denies difficulty breathing, shortness of breath, cough or sputum production.   Cardiovascular: Denies chest pain, chest tightness, palpitations or swelling in the hands or feet.  Skin: Pt reports swelling of the neck. Denies redness, rashes, lesions or ulcercations.   No other specific complaints in a complete review of systems (except as listed in HPI above).  Objective:   Physical Exam   BP 122/76  Pulse 90  Temp(Src) 98.1 F (36.7 C) (Oral)  Wt 259 lb (117.482 kg)  SpO2 98% Wt Readings from Last 3 Encounters:  11/21/13 259 lb (117.482 kg)  10/10/13 258 lb 8 oz (117.255 kg)  10/30/12 254 lb 4 oz (115.327  kg)    General: Appears his stated age, well developed, well nourished in NAD. Skin: Warm, dry and intact. Slight swelling of the left side of the neck, no mass noted. I think this is just adipose tissue. Neck: Normal range of motion. Neck supple, trachea midline. No massses, lumps or thyromegaly present.  Cardiovascular: Normal rate and rhythm. S1,S2 noted.  No murmur, rubs or gallops noted. No JVD or BLE edema. No carotid bruits noted. Pulmonary/Chest: Normal effort and positive vesicular breath sounds. No respiratory distress. No wheezes, rales or ronchi noted.   BMET    Component Value Date/Time   NA 140 10/10/2013 0902   K 4.4 10/10/2013 0902   CL 106 10/10/2013 0902   CO2 30 10/10/2013 0902   GLUCOSE 104* 10/10/2013 0902   BUN 12 10/10/2013 0902   CREATININE 1.2 10/10/2013 0902   CALCIUM 9.6 10/10/2013 0902   GFRNONAA 65*  05/16/2012 0802   GFRAA 75* 05/16/2012 0802    Lipid Panel     Component Value Date/Time   CHOL 205* 10/31/2007 0951   TRIG 91 10/31/2007 0951   HDL 40.8 10/31/2007 0951   CHOLHDL 5.0 CALC 10/31/2007 0951   VLDL 18 10/31/2007 0951    CBC    Component Value Date/Time   WBC 7.1 10/10/2013 0902   RBC 4.74 10/10/2013 0902   HGB 15.1 10/10/2013 0902   HCT 44.7 10/10/2013 0902   PLT 201.0 10/10/2013 0902   MCV 94.2 10/10/2013 0902   MCH 32.9 05/16/2012 0802   MCHC 33.8 10/10/2013 0902   RDW 13.3 10/10/2013 0902   LYMPHSABS 1.8 05/16/2012 0802   MONOABS 0.6 05/16/2012 0802   EOSABS 0.1 05/16/2012 0802   BASOSABS 0.1 05/16/2012 0802    Hgb A1C No results found for this basename: HGBA1C        Assessment & Plan:   Fatigue:  B12 was normal at last visit Maybe TSH is not WNL-will recheck today and dose adjust if needed  Swelling of neck:  Ultrasound reviewed-normal I thinks this is likely just adipose tissue  Will let you know about lab results, RTC for next scheduled appt with PCP

## 2013-11-21 NOTE — Assessment & Plan Note (Signed)
Started on synthroid 09/2013 Will recheck TSH/T4 and dose adjust as needed

## 2013-11-22 ENCOUNTER — Other Ambulatory Visit: Payer: Self-pay

## 2013-11-22 MED ORDER — LEVOTHYROXINE SODIUM 50 MCG PO TABS: 50.0000 ug | ORAL_TABLET | Freq: Every day | ORAL | Status: DC

## 2013-11-22 NOTE — Addendum Note (Signed)
Addended by: Lurlean Nanny on: 11/22/2013 11:43 AM   Modules accepted: Orders

## 2013-12-23 ENCOUNTER — Other Ambulatory Visit: Payer: Self-pay | Admitting: Internal Medicine

## 2014-01-01 ENCOUNTER — Other Ambulatory Visit (INDEPENDENT_AMBULATORY_CARE_PROVIDER_SITE_OTHER): Payer: 59

## 2014-01-01 DIAGNOSIS — E039 Hypothyroidism, unspecified: Secondary | ICD-10-CM

## 2014-01-01 DIAGNOSIS — R221 Localized swelling, mass and lump, neck: Secondary | ICD-10-CM

## 2014-01-01 DIAGNOSIS — R5381 Other malaise: Secondary | ICD-10-CM

## 2014-01-01 DIAGNOSIS — R220 Localized swelling, mass and lump, head: Secondary | ICD-10-CM

## 2014-01-01 DIAGNOSIS — R5383 Other fatigue: Secondary | ICD-10-CM

## 2014-01-01 LAB — TSH: TSH: 5.28 u[IU]/mL — AB (ref 0.35–4.50)

## 2014-01-01 LAB — T4, FREE: FREE T4: 0.84 ng/dL (ref 0.60–1.60)

## 2014-01-03 MED ORDER — LEVOTHYROXINE SODIUM 75 MCG PO TABS: 75.0000 ug | ORAL_TABLET | Freq: Every day | ORAL | Status: DC

## 2014-01-03 NOTE — Addendum Note (Signed)
Addended by: Lurlean Nanny on: 01/03/2014 08:54 AM   Modules accepted: Orders

## 2014-01-03 NOTE — Addendum Note (Signed)
Addended by: Lurlean Nanny on: 01/03/2014 08:52 AM   Modules accepted: Orders

## 2014-01-25 ENCOUNTER — Other Ambulatory Visit: Payer: Self-pay | Admitting: Internal Medicine

## 2014-02-01 ENCOUNTER — Other Ambulatory Visit: Payer: Self-pay | Admitting: Internal Medicine

## 2014-02-04 ENCOUNTER — Other Ambulatory Visit (INDEPENDENT_AMBULATORY_CARE_PROVIDER_SITE_OTHER): Payer: 59

## 2014-02-04 DIAGNOSIS — R220 Localized swelling, mass and lump, head: Secondary | ICD-10-CM

## 2014-02-04 DIAGNOSIS — E039 Hypothyroidism, unspecified: Secondary | ICD-10-CM

## 2014-02-04 DIAGNOSIS — R5381 Other malaise: Secondary | ICD-10-CM

## 2014-02-04 DIAGNOSIS — R221 Localized swelling, mass and lump, neck: Secondary | ICD-10-CM

## 2014-02-04 DIAGNOSIS — R5383 Other fatigue: Secondary | ICD-10-CM

## 2014-02-04 LAB — T4, FREE: Free T4: 0.87 ng/dL (ref 0.60–1.60)

## 2014-02-04 LAB — TSH: TSH: 3.8 u[IU]/mL (ref 0.35–4.50)

## 2014-02-07 ENCOUNTER — Other Ambulatory Visit: Payer: Self-pay

## 2014-03-08 ENCOUNTER — Other Ambulatory Visit: Payer: Self-pay | Admitting: Internal Medicine

## 2014-05-24 ENCOUNTER — Other Ambulatory Visit: Payer: Self-pay | Admitting: Family Medicine

## 2014-06-23 ENCOUNTER — Other Ambulatory Visit: Payer: Self-pay | Admitting: Family Medicine

## 2014-08-18 ENCOUNTER — Other Ambulatory Visit: Payer: Self-pay | Admitting: *Deleted

## 2014-08-18 ENCOUNTER — Other Ambulatory Visit: Payer: Self-pay | Admitting: Family Medicine

## 2014-08-18 NOTE — Telephone Encounter (Signed)
Patient had normal TSH on 02/04/14.

## 2014-08-21 ENCOUNTER — Emergency Department
Admit: 2014-08-21 | Disposition: A | Discharge status: Home / Self Care | Payer: Self-pay | Admitting: Emergency Medicine

## 2014-09-12 ENCOUNTER — Ambulatory Visit (INDEPENDENT_AMBULATORY_CARE_PROVIDER_SITE_OTHER): Payer: 59 | Admitting: Family Medicine

## 2014-09-12 ENCOUNTER — Encounter: Payer: Self-pay | Admitting: Family Medicine

## 2014-09-12 VITALS — BP 124/72 | HR 66 | Temp 97.8°F | Wt 274.2 lb

## 2014-09-12 DIAGNOSIS — R21 Rash and other nonspecific skin eruption: Secondary | ICD-10-CM | POA: Diagnosis not present

## 2014-09-12 DIAGNOSIS — T148 Other injury of unspecified body region: Secondary | ICD-10-CM

## 2014-09-12 DIAGNOSIS — W57XXXA Bitten or stung by nonvenomous insect and other nonvenomous arthropods, initial encounter: Secondary | ICD-10-CM | POA: Diagnosis not present

## 2014-09-12 MED ORDER — DOXYCYCLINE HYCLATE 100 MG PO CAPS: 100.0000 mg | ORAL_CAPSULE | Freq: Two times a day (BID) | ORAL | Status: DC

## 2014-09-12 NOTE — Progress Notes (Signed)
BP 124/72 mmHg  Pulse 66  Temp(Src) 97.8 F (36.6 C) (Oral)  Wt 274 lb 4 oz (124.399 kg)  SpO2 96%   CC: rash after bug bite  Subjective:    Patient ID: Miguel Mendez, male    DOB: September 27, 1955, 59 y.o.   MRN: [id number]  HPI: Miguel Mendez is a 59 y.o. male presenting on 09/12/2014 for Insect Bite and Rash   Rash on L ankle noticed after he had 4 tick bites over last 2 weeks. Rash not pruritic. 2 bites on legs and 2 on back. One was on L ankle. Unsure how long ticks attached - thinks ~8 hours, not more than 24 hours. Also feeling somewhat nauseated over last 3 days associated with diarrhea. Also had R leg cramp yesterday.  No fevers/chills, joint pains, headaches. No other rash anywhere.  Relevant past medical, surgical, family and social history reviewed and updated as indicated. Interim medical history since our last visit reviewed. Allergies and medications reviewed and updated. Current Outpatient Prescriptions on File Prior to Visit  Medication Sig  . ibuprofen (ADVIL,MOTRIN) 200 MG tablet Take 200 mg by mouth as needed for pain.  Marland Kitchen levothyroxine (SYNTHROID, LEVOTHROID) 75 MCG tablet TAKE 1 TABLET BY MOUTH DAILY.  . Multiple Vitamin (MULTIVITAMIN) tablet Take 1 tablet by mouth daily.    . NON FORMULARY CPAP    No current facility-administered medications on file prior to visit.    Review of Systems Per HPI unless specifically indicated above     Objective:    BP 124/72 mmHg  Pulse 66  Temp(Src) 97.8 F (36.6 C) (Oral)  Wt 274 lb 4 oz (124.399 kg)  SpO2 96%  Wt Readings from Last 3 Encounters:  09/12/14 274 lb 4 oz (124.399 kg)  11/21/13 259 lb (117.482 kg)  10/10/13 258 lb 8 oz (117.255 kg)    Physical Exam  Constitutional: He appears well-developed and well-nourished. No distress.  HENT:  Head: Normocephalic and atraumatic.  Mouth/Throat: Oropharynx is clear and moist. No oropharyngeal exudate.  Eyes: Conjunctivae and EOM are normal. Pupils are equal,  round, and reactive to light. No scleral icterus.  Neck: Normal range of motion. Neck supple.  Cardiovascular: Normal rate, regular rhythm, normal heart sounds and intact distal pulses.   No murmur heard. Pulmonary/Chest: Effort normal and breath sounds normal. No respiratory distress. He has no wheezes. He has no rales.  Abdominal: Soft. Normal appearance and bowel sounds are normal. He exhibits no distension and no mass. There is no hepatosplenomegaly. There is no tenderness. There is no rigidity, no rebound, no guarding, no CVA tenderness and negative Murphy's sign.  Musculoskeletal: He exhibits no edema.  Skin: Skin is warm and dry. Rash noted.  nonblanching petechial rash 4cm diameter around site of tick bite L medial ankle, nonpruritic, no central clearing  Psychiatric: He has a normal mood and affect.  Nursing note and vitals reviewed.  Results for orders placed or performed in visit on 02/04/14  TSH  Result Value Ref Range   TSH 3.80 0.35 - 4.50 uIU/mL  T4, Free  Result Value Ref Range   Free T4 0.87 0.60 - 1.60 ng/dL      Assessment & Plan:   Problem List Items Addressed This Visit    Skin rash - Primary    Petechial rash around site of tick bite that developed 10d after bite. Not consistent with RMSF or lyme disease rash, but given rash + nausea after tick bites, likely prudent  to cover for tick borne illness Treat with 10d doxycycline. Discussed avoidance of tick bites. Discussed red flags to seek care, return for further evaluation.      Tick bite       Follow up plan: Return if symptoms worsen or fail to improve.

## 2014-09-12 NOTE — Patient Instructions (Addendum)
Given tick bite 2 wks ago with new rash and nausea, I do suggest starting doxycycline antibiotic for 10 days to cover any tick born illness. Watch for sunburns on this medicine.  Let us know if spreading rash, new fevers or new worsening symptoms  Tick Bite Information Ticks are insects that attach themselves to the skin and draw blood for food. There are various types of ticks. Common types include wood ticks and deer ticks. Most ticks live in shrubs and grassy areas. Ticks can climb onto your body when you make contact with leaves or grass where the tick is waiting. The most common places on the body for ticks to attach themselves are the scalp, neck, armpits, waist, and groin. Most tick bites are harmless, but sometimes ticks carry germs that cause diseases. These germs can be spread to a person during the tick's feeding process. The chance of a disease spreading through a tick bite depends on:   The type of tick.  Time of year.   How long the tick is attached.   Geographic location.  HOW CAN YOU PREVENT TICK BITES? Take these steps to help prevent tick bites when you are outdoors:  Wear protective clothing. Long sleeves and long pants are best.   Wear white clothes so you can see ticks more easily.  Tuck your pant legs into your socks.   If walking on a trail, stay in the middle of the trail to avoid brushing against bushes.  Avoid walking through areas with long grass.  Put insect repellent on all exposed skin and along boot tops, pant legs, and sleeve cuffs.   Check clothing, hair, and skin repeatedly and before going inside.   Brush off any ticks that are not attached.  Take a shower or bath as soon as possible after being outdoors.  WHAT IS THE PROPER WAY TO REMOVE A TICK? Ticks should be removed as soon as possible to help prevent diseases caused by tick bites. 1. If latex gloves are available, put them on before trying to remove a tick.  2. Using fine-point  tweezers, grasp the tick as close to the skin as possible. You may also use curved forceps or a tick removal tool. Grasp the tick as close to its head as possible. Avoid grasping the tick on its body. 3. Pull gently with steady upward pressure until the tick lets go. Do not twist the tick or jerk it suddenly. This may break off the tick's head or mouth parts. 4. Do not squeeze or crush the tick's body. This could force disease-carrying fluids from the tick into your body.  5. After the tick is removed, wash the bite area and your hands with soap and water or other disinfectant such as alcohol. 6. Apply a small amount of antiseptic cream or ointment to the bite site.  7. Wash and disinfect any instruments that were used.  Do not try to remove a tick by applying a hot match, petroleum jelly, or fingernail polish to the tick. These methods do not work and may increase the chances of disease being spread from the tick bite.  WHEN SHOULD YOU SEEK MEDICAL CARE? Contact your health care provider if you are unable to remove a tick from your skin or if a part of the tick breaks off and is stuck in the skin.  After a tick bite, you need to be aware of signs and symptoms that could be related to diseases spread by ticks. Contact your health  care provider if you develop any of the following in the days or weeks after the tick bite:  Unexplained fever.  Rash. A circular rash that appears days or weeks after the tick bite may indicate the possibility of Lyme disease. The rash may resemble a target with a bull's-eye and may occur at a different part of your body than the tick bite.  Redness and swelling in the area of the tick bite.   Tender, swollen lymph glands.   Diarrhea.   Weight loss.   Cough.   Fatigue.   Muscle, joint, or bone pain.   Abdominal pain.   Headache.   Lethargy or a change in your level of consciousness.  Difficulty walking or moving your legs.   Numbness in the  legs.   Paralysis.  Shortness of breath.   Confusion.   Repeated vomiting.  Document Released: 04/08/2000 Document Revised: 01/30/2013 Document Reviewed: 09/19/2012 [facility] Patient Information 2015 [place], [place]. This information is not intended to replace advice given to you by your health care provider. Make sure you discuss any questions you have with your health care provider.

## 2014-09-12 NOTE — Progress Notes (Signed)
Pre visit review using our clinic review tool, if applicable. No additional management support is needed unless otherwise documented below in the visit note. 

## 2014-09-12 NOTE — Assessment & Plan Note (Signed)
Petechial rash around site of tick bite that developed 10d after bite. Not consistent with RMSF or lyme disease rash, but given rash + nausea after tick bites, likely prudent to cover for tick borne illness Treat with 10d doxycycline. Discussed avoidance of tick bites. Discussed red flags to seek care, return for further evaluation.

## 2015-09-04 ENCOUNTER — Telehealth: Payer: Self-pay | Admitting: Family Medicine

## 2015-09-04 NOTE — Telephone Encounter (Signed)
No recent/future appts., please advise  

## 2015-09-04 NOTE — Telephone Encounter (Signed)
Please schedule f/u and refill until then  

## 2015-09-07 NOTE — Telephone Encounter (Signed)
Left detailed message on voicemail to schedule appt.  Refilled med for 1 month only.

## 2015-09-07 NOTE — Telephone Encounter (Signed)
Pt made appointment 5/19

## 2015-09-11 ENCOUNTER — Ambulatory Visit (INDEPENDENT_AMBULATORY_CARE_PROVIDER_SITE_OTHER): Payer: 59 | Admitting: Family Medicine

## 2015-09-11 ENCOUNTER — Encounter: Payer: Self-pay | Admitting: Family Medicine

## 2015-09-11 VITALS — BP 110/70 | HR 78 | Temp 97.6°F | Ht 73.0 in | Wt 264.5 lb

## 2015-09-11 DIAGNOSIS — R600 Localized edema: Secondary | ICD-10-CM | POA: Insufficient documentation

## 2015-09-11 DIAGNOSIS — E785 Hyperlipidemia, unspecified: Secondary | ICD-10-CM

## 2015-09-11 DIAGNOSIS — E039 Hypothyroidism, unspecified: Secondary | ICD-10-CM

## 2015-09-11 DIAGNOSIS — T148 Other injury of unspecified body region: Secondary | ICD-10-CM

## 2015-09-11 DIAGNOSIS — W57XXXA Bitten or stung by nonvenomous insect and other nonvenomous arthropods, initial encounter: Secondary | ICD-10-CM | POA: Insufficient documentation

## 2015-09-11 DIAGNOSIS — Z114 Encounter for screening for human immunodeficiency virus [HIV]: Secondary | ICD-10-CM

## 2015-09-11 DIAGNOSIS — Z8042 Family history of malignant neoplasm of prostate: Secondary | ICD-10-CM

## 2015-09-11 DIAGNOSIS — Z1159 Encounter for screening for other viral diseases: Secondary | ICD-10-CM

## 2015-09-11 LAB — LIPID PANEL
Cholesterol: 205 mg/dL — ABNORMAL HIGH (ref 0–200)
HDL: 42.8 mg/dL (ref >39.00)
LDL CALC: 136 mg/dL — AB (ref 0–99)
NONHDL: 162.02
Total CHOL/HDL Ratio: 5
Triglycerides: 128 mg/dL (ref 0.0–149.0)
VLDL: 25.6 mg/dL (ref 0.0–40.0)

## 2015-09-11 LAB — COMPREHENSIVE METABOLIC PANEL
ALK PHOS: 61 U/L (ref 39–117)
ALT: 32 U/L (ref 0–53)
AST: 26 U/L (ref 0–37)
Albumin: 4.5 g/dL (ref 3.5–5.2)
BUN: 12 mg/dL (ref 6–23)
CO2: 27 meq/L (ref 19–32)
CREATININE: 1.11 mg/dL (ref 0.40–1.50)
Calcium: 9.3 mg/dL (ref 8.4–10.5)
Chloride: 105 mEq/L (ref 96–112)
GFR: 72.13 mL/min (ref >60.00)
Glucose, Bld: 95 mg/dL (ref 70–99)
Potassium: 4.2 mEq/L (ref 3.5–5.1)
Sodium: 140 mEq/L (ref 135–145)
Total Bilirubin: 0.7 mg/dL (ref 0.2–1.2)
Total Protein: 6.9 g/dL (ref 6.0–8.3)

## 2015-09-11 LAB — TSH: TSH: 3.09 u[IU]/mL (ref 0.35–4.50)

## 2015-09-11 NOTE — Assessment & Plan Note (Signed)
Hep C screen today with labs

## 2015-09-11 NOTE — Assessment & Plan Note (Signed)
TSH today No clinical changes except for pedal edema occ  No change in exam

## 2015-09-11 NOTE — Assessment & Plan Note (Signed)
Due for annual urology appt-pt will make his own appt

## 2015-09-11 NOTE — Patient Instructions (Addendum)
Eat a healthy diet  Stay active  Keep loosing weight  Watch the spot on your ankle-keep it clean with soap and water and update me if it does not resolve  When you sit - try to elevate feet  When you stand - keep walking/moving Wear support socks if you need to  Avoid too much sodium/ processed foods and drink more water to prevent swelling  See your urologist for yearly follow up  Labs today for chemistries and thyroid and cholesterol and screening for Hep C and HIV

## 2015-09-11 NOTE — Assessment & Plan Note (Signed)
Lump on R ant ankle resembles insect bite with a scab (and some bruising) No hx of tick bite  inst to keep clean and dry Update if not resolved in 2 wk- would consider further eval

## 2015-09-11 NOTE — Assessment & Plan Note (Signed)
Screen with lab today

## 2015-09-11 NOTE — Assessment & Plan Note (Signed)
Disc goals for lipids and reasons to control them Lab today  Rev low sat fat diet in detail

## 2015-09-11 NOTE — Progress Notes (Signed)
Pre visit review using our clinic review tool, if applicable. No additional management support is needed unless otherwise documented below in the visit note. 

## 2015-09-11 NOTE — Progress Notes (Signed)
Subjective:    Patient ID: Miguel Mendez, male    DOB: [birth year], 60 y.o.   MRN: [id number]  HPI Here for f/u of chronic medical problems   Has been busy  Work place is moving  Working outdoors a lot -enjoys it   [employer] is down 10 lb with bmi of 34  Walking a lot -more exercise  Taking care of himself   Some issues with leg swelling  For a while -more than 6 mo  ? If due to his thyroid or not   No sob of cp  Has a spot on R ankle - ? Insect bite (bruise around it)   Hypothyroid  Almost never misses a dose -takes it first thing in the am  Not as tired as he was  Due for tsh   Non smoker now   BP Readings from Last 3 Encounters:  09/11/15 110/70  09/12/14 124/72  11/21/13 122/76    Is due to see urol- strong fam hx of prostate cancer  Last psa tests were stable -inc duration to yearly   Eating healthy- loves salads and vegetables occ red meat or ham  Usually eats chicken and [place]   Patient Active Problem List   Diagnosis Date Noted  . Insect bite 09/11/2015  . Pedal edema 09/11/2015  . Need for hepatitis C screening test 09/11/2015  . Screening for HIV (human immunodeficiency virus) 09/11/2015  . Hypothyroidism 11/21/2013  . Family history of prostate cancer 10/30/2012  . OBSTRUCTIVE SLEEP APNEA 11/08/2007  . HYPOGONADISM 10/30/2007  . Hyperlipidemia 10/30/2007   Past Medical History  Diagnosis Date  . Hyperlipidemia   . Tobacco use disorder   . Family history of malignant neoplasm of prostate   . Other testicular hypofunction   . Other malaise and fatigue   . Acute upper respiratory infections of unspecified site    No past surgical history on file. Social History  Substance Use Topics  . Smoking status: Former Smoker    Types: Cigars  . Smokeless tobacco: None     Comment: quit 03/2013  . Alcohol Use: 0.0 oz/week    0 Standard drinks or equivalent per week     Comment: Rare   Family History  Problem Relation Age of Onset  . Prostate  cancer Father   . Coronary artery disease Father   . Prostate cancer Brother   . Coronary artery disease Maternal Grandfather   . Coronary artery disease Paternal Grandfather     in old age  . Diabetes Cousin   . Diabetes Son    Allergies  Allergen Reactions  . Bee Venom Swelling   Current Outpatient Prescriptions on File Prior to Visit  Medication Sig Dispense Refill  . ibuprofen (ADVIL,MOTRIN) 200 MG tablet Take 200 mg by mouth as needed for pain.    Marland Kitchen levothyroxine (SYNTHROID, LEVOTHROID) 75 MCG tablet TAKE 1 TABLET BY MOUTH DAILY. 30 tablet 0  . Multiple Vitamin (MULTIVITAMIN) tablet Take 1 tablet by mouth daily.      . NON FORMULARY CPAP      No current facility-administered medications on file prior to visit.    Review of Systems Review of Systems  Constitutional: Negative for fever, appetite change, fatigue and unexpected weight change.  Eyes: Negative for pain and visual disturbance.  Respiratory: Negative for cough and shortness of breath.   Cardiovascular: Negative for cp or palpitations   pos for ankle swelling at the end of the day Gastrointestinal: Negative for  nausea, diarrhea and constipation.  Genitourinary: Negative for urgency and frequency.  Skin: Negative for pallor or rash   Neurological: Negative for weakness, light-headedness, numbness and headaches.  Hematological: Negative for adenopathy. Does not bruise/bleed easily.  Psychiatric/Behavioral: Negative for dysphoric mood. The patient is not nervous/anxious.         Objective:   Physical Exam  Constitutional: He appears well-developed and well-nourished. No distress.  obese and well appearing   HENT:  Head: Normocephalic and atraumatic.  Mouth/Throat: Oropharynx is clear and moist.  Eyes: Conjunctivae and EOM are normal. Pupils are equal, round, and reactive to light.  Neck: Normal range of motion. Neck supple. No JVD present. Carotid bruit is not present. No thyromegaly present.  Cardiovascular:  Normal rate, regular rhythm, normal heart sounds and intact distal pulses.  Exam reveals no gallop.   Pulmonary/Chest: Effort normal and breath sounds normal. No respiratory distress. He has no wheezes. He has no rales.  No crackles  Abdominal: Soft. Bowel sounds are normal. He exhibits no distension, no abdominal bruit and no mass. There is no tenderness.  Musculoskeletal: He exhibits edema. He exhibits no tenderness.  Trace pedal edema-not pitting Sock line noted   Lymphadenopathy:    He has no cervical adenopathy.  Neurological: He is alert. He has normal reflexes. No cranial nerve deficit. He exhibits normal muscle tone. Coordination normal.  Skin: Skin is warm and dry. No rash noted. No pallor.  1 cm lump on ant R ankle with tiny scab and some surrounding ecchymosis  Expressed sm amt of clear fluid-nt    Psychiatric: He has a normal mood and affect.          Assessment & Plan:   Problem List Items Addressed This Visit      Endocrine   Hypothyroidism - Primary    TSH today No clinical changes except for pedal edema occ  No change in exam       Relevant Orders   TSH     Other   Screening for HIV (human immunodeficiency virus)    Screen with lab today      Relevant Orders   HIV antibody (with reflex)   Pedal edema    Intermittent-worse with heat and standing  No cardiac symptoms  bp too low for diuretic currently  Enc wt loss Inc water and less salt/processed foods Has supp hose for prn use  Continue to follow cmet today      Relevant Orders   Comprehensive metabolic panel   Need for hepatitis C screening test    Hep C screen today with labs       Relevant Orders   Hepatitis C antibody   Insect bite    Lump on R ant ankle resembles insect bite with a scab (and some bruising) No hx of tick bite  inst to keep clean and dry Update if not resolved in 2 wk- would consider further eval      Hyperlipidemia    Disc goals for lipids and reasons to control  them Lab today  Rev low sat fat diet in detail       Relevant Orders   Lipid panel   Family history of prostate cancer    Due for annual urology appt-pt will make his own appt

## 2015-09-11 NOTE — Assessment & Plan Note (Signed)
Intermittent-worse with heat and standing  No cardiac symptoms  bp too low for diuretic currently  Enc wt loss Inc water and less salt/processed foods Has supp hose for prn use  Continue to follow cmet today

## 2015-09-12 LAB — HIV ANTIBODY (ROUTINE TESTING W REFLEX): HIV: NONREACTIVE

## 2015-09-12 LAB — HEPATITIS C ANTIBODY: HCV AB: NEGATIVE

## 2015-10-12 ENCOUNTER — Other Ambulatory Visit: Payer: Self-pay | Admitting: Family Medicine

## 2016-04-11 ENCOUNTER — Other Ambulatory Visit: Payer: Self-pay | Admitting: Family Medicine

## 2016-08-10 ENCOUNTER — Other Ambulatory Visit: Payer: Self-pay | Admitting: Family Medicine

## 2016-08-10 NOTE — Telephone Encounter (Signed)
Please schedule f/u this summer and refill until then  Labs prior if he wants  Thanks

## 2016-08-10 NOTE — Telephone Encounter (Signed)
Pt hasn't been seen in almost a year and no future appts., please advise  

## 2016-08-12 NOTE — Telephone Encounter (Signed)
appts scheduled and med refilled  

## 2016-09-25 ENCOUNTER — Telehealth: Payer: Self-pay | Admitting: Family Medicine

## 2016-09-25 DIAGNOSIS — E039 Hypothyroidism, unspecified: Secondary | ICD-10-CM

## 2016-09-25 DIAGNOSIS — E7800 Pure hypercholesterolemia, unspecified: Secondary | ICD-10-CM

## 2016-09-25 DIAGNOSIS — R600 Localized edema: Secondary | ICD-10-CM

## 2016-09-25 NOTE — Telephone Encounter (Signed)
-----   Message from Ellamae Sia sent at 09/22/2016  3:17 PM EDT ----- Regarding: Lab orders for Friday, 6.8.18 Lab orders for a f/u appt

## 2016-09-30 ENCOUNTER — Other Ambulatory Visit (INDEPENDENT_AMBULATORY_CARE_PROVIDER_SITE_OTHER): Payer: 59

## 2016-09-30 DIAGNOSIS — E039 Hypothyroidism, unspecified: Secondary | ICD-10-CM | POA: Diagnosis not present

## 2016-09-30 DIAGNOSIS — R600 Localized edema: Secondary | ICD-10-CM

## 2016-09-30 DIAGNOSIS — E7800 Pure hypercholesterolemia, unspecified: Secondary | ICD-10-CM | POA: Diagnosis not present

## 2016-09-30 LAB — COMPREHENSIVE METABOLIC PANEL WITH GFR
ALT: 24 U/L (ref 0–53)
AST: 20 U/L (ref 0–37)
Albumin: 4.3 g/dL (ref 3.5–5.2)
Alkaline Phosphatase: 47 U/L (ref 39–117)
BUN: 18 mg/dL (ref 6–23)
CO2: 30 meq/L (ref 19–32)
Calcium: 9.4 mg/dL (ref 8.4–10.5)
Chloride: 109 meq/L (ref 96–112)
Creatinine, Ser: 1.3 mg/dL (ref 0.40–1.50)
GFR: 59.89 mL/min — ABNORMAL LOW
Glucose, Bld: 110 mg/dL — ABNORMAL HIGH (ref 70–99)
Potassium: 4.2 meq/L (ref 3.5–5.1)
Sodium: 144 meq/L (ref 135–145)
Total Bilirubin: 0.6 mg/dL (ref 0.2–1.2)
Total Protein: 6.8 g/dL (ref 6.0–8.3)

## 2016-09-30 LAB — LIPID PANEL
Cholesterol: 186 mg/dL (ref 0–200)
HDL: 42.4 mg/dL
LDL Cholesterol: 122 mg/dL — ABNORMAL HIGH (ref 0–99)
NonHDL: 143.37
Total CHOL/HDL Ratio: 4
Triglycerides: 109 mg/dL (ref 0.0–149.0)
VLDL: 21.8 mg/dL (ref 0.0–40.0)

## 2016-09-30 LAB — TSH: TSH: 6.21 u[IU]/mL — ABNORMAL HIGH (ref 0.35–4.50)

## 2016-10-04 ENCOUNTER — Ambulatory Visit (INDEPENDENT_AMBULATORY_CARE_PROVIDER_SITE_OTHER): Payer: 59 | Admitting: Family Medicine

## 2016-10-04 ENCOUNTER — Encounter: Payer: Self-pay | Admitting: Family Medicine

## 2016-10-04 ENCOUNTER — Encounter: Payer: Self-pay | Admitting: Internal Medicine

## 2016-10-04 VITALS — BP 118/68 | HR 75 | Temp 98.4°F | Ht 73.0 in | Wt 257.8 lb

## 2016-10-04 DIAGNOSIS — E7800 Pure hypercholesterolemia, unspecified: Secondary | ICD-10-CM | POA: Diagnosis not present

## 2016-10-04 DIAGNOSIS — R7309 Other abnormal glucose: Secondary | ICD-10-CM | POA: Diagnosis not present

## 2016-10-04 DIAGNOSIS — Z8042 Family history of malignant neoplasm of prostate: Secondary | ICD-10-CM

## 2016-10-04 DIAGNOSIS — E66811 Obesity, class 1: Secondary | ICD-10-CM

## 2016-10-04 DIAGNOSIS — E669 Obesity, unspecified: Secondary | ICD-10-CM

## 2016-10-04 DIAGNOSIS — Z1211 Encounter for screening for malignant neoplasm of colon: Secondary | ICD-10-CM

## 2016-10-04 DIAGNOSIS — E039 Hypothyroidism, unspecified: Secondary | ICD-10-CM

## 2016-10-04 MED ORDER — LEVOTHYROXINE SODIUM 88 MCG PO TABS: 88.0000 ug | ORAL_TABLET | Freq: Every day | ORAL | 11 refills | Status: DC

## 2016-10-04 NOTE — Assessment & Plan Note (Signed)
Discussed how this problem influences overall health and the risks it imposes  Reviewed plan for weight loss with lower calorie diet (via better food choices and also portion control or program like weight watchers) and exercise building up to or more than 30 minutes 5 days per week including some aerobic activity    

## 2016-10-04 NOTE — Assessment & Plan Note (Signed)
Disc goals for lipids and reasons to control them Rev labs with pt Rev low sat fat diet in detail LDL is coming down with improved diet  Disc other bkfast options (besides sausage biscuit)

## 2016-10-04 NOTE — Progress Notes (Signed)
Subjective:    Patient ID: Miguel Mendez, male    DOB: Jun 16, 1955, 61 y.o.   MRN: [id number]  HPI Here for f/u of chronic health problems   Doing ok overall   Mother passed away this year - had dementia  Doing fine with grief   Wt Readings from Last 3 Encounters:  10/04/16 257 lb 12 oz (116.9 kg)  09/11/15 264 lb 8 oz (120 kg)  09/12/14 274 lb 4 oz (124.4 kg)  walking a lot  Down 7 lb  Eating better - loves salads  bmi 34.01   Hypothyroidism  Pt has no clinical changes No change in hair or skin/ edema and no tremor   Notes being more sluggish lately  Lab Results  Component Value Date   TSH 6.21 (H) 09/30/2016    Takes levothyrox 75 mcg  He takes it am 30 min before eating and no missed doses   Hx of hyperlipidemia Lab Results  Component Value Date   CHOL 186 09/30/2016   CHOL 205 (H) 09/11/2015   CHOL 205 (HH) 10/31/2007   Lab Results  Component Value Date   HDL 42.40 09/30/2016   HDL 42.80 09/11/2015   HDL 40.8 10/31/2007   Lab Results  Component Value Date   LDLCALC 122 (H) 09/30/2016   LDLCALC 136 (H) 09/11/2015   Lab Results  Component Value Date   TRIG 109.0 09/30/2016   TRIG 128.0 09/11/2015   TRIG 91 10/31/2007   Lab Results  Component Value Date   CHOLHDL 4 09/30/2016   CHOLHDL 5 09/11/2015   CHOLHDL 5.0 CALC 10/31/2007   Lab Results  Component Value Date   LDLDIRECT 149.3 10/31/2007   Diet controlled  It is better - and LDL is down  Trying to avoid fast food (with a few exceptions)  Does eat sausage biscuit     Chemistry      Component Value Date/Time   NA 144 09/30/2016 0846   K 4.2 09/30/2016 0846   CL 109 09/30/2016 0846   CO2 30 09/30/2016 0846   BUN 18 09/30/2016 0846   CREATININE 1.30 09/30/2016 0846      Component Value Date/Time   CALCIUM 9.4 09/30/2016 0846   ALKPHOS 47 09/30/2016 0846   AST 20 09/30/2016 0846   ALT 24 09/30/2016 0846   BILITOT 0.6 09/30/2016 0846      Glucose slt elevated at 110  Son has  diabetes and aunts and uncles  Walking regularly and also an active job  He does like sweets  He drinks juice every am    Needs ref for colonoscopy-first screening  Wants to set that up in aug if poss   Patient Active Problem List   Diagnosis Date Noted  . Colon cancer screening 10/04/2016  . Elevated glucose level 10/04/2016  . Pedal edema 09/11/2015  . Need for hepatitis C screening test 09/11/2015  . Screening for HIV (human immunodeficiency virus) 09/11/2015  . Hypothyroidism 11/21/2013  . Family history of prostate cancer 10/30/2012  . OBSTRUCTIVE SLEEP APNEA 11/08/2007  . HYPOGONADISM 10/30/2007  . Hyperlipidemia 10/30/2007   Past Medical History:  Diagnosis Date  . Acute upper respiratory infections of unspecified site   . Family history of malignant neoplasm of prostate   . Hyperlipidemia   . Other malaise and fatigue   . Other testicular hypofunction   . Tobacco use disorder    No past surgical history on file. Social History  Substance Use Topics  .  Smoking status: Former Smoker    Types: Cigars  . Smokeless tobacco: Never Used     Comment: quit 03/2013  . Alcohol use 0.0 oz/week     Comment: Rare   Family History  Problem Relation Age of Onset  . Prostate cancer Father   . Coronary artery disease Father   . Prostate cancer Brother   . Coronary artery disease Maternal Grandfather   . Coronary artery disease Paternal Grandfather        in old age  . Diabetes Cousin   . Diabetes Son    Allergies  Allergen Reactions  . Bee Venom Swelling   Current Outpatient Prescriptions on File Prior to Visit  Medication Sig Dispense Refill  . ibuprofen (ADVIL,MOTRIN) 200 MG tablet Take 200 mg by mouth as needed for pain.    . Multiple Vitamin (MULTIVITAMIN) tablet Take 1 tablet by mouth daily.      . NON FORMULARY CPAP      No current facility-administered medications on file prior to visit.     Review of Systems    Review of Systems  Constitutional:  Negative for fever, appetite change,  and unexpected weight change. pos for some fatigue  Eyes: Negative for pain and visual disturbance.  Respiratory: Negative for cough and shortness of breath.   Cardiovascular: Negative for cp or palpitations    Gastrointestinal: Negative for nausea, diarrhea and constipation.  Genitourinary: Negative for urgency and frequency.  Skin: Negative for pallor or rash   Neurological: Negative for weakness, light-headedness, numbness and headaches.  Hematological: Negative for adenopathy. Does not bruise/bleed easily.  Psychiatric/Behavioral: Negative for dysphoric mood. The patient is not nervous/anxious.      Objective:   Physical Exam  Constitutional: He appears well-developed and well-nourished. No distress.  obese and well appearing   HENT:  Head: Normocephalic and atraumatic.  Mouth/Throat: Oropharynx is clear and moist.  Eyes: Conjunctivae and EOM are normal. Pupils are equal, round, and reactive to light.  Neck: Normal range of motion. Neck supple. No JVD present. Carotid bruit is not present. No thyromegaly present.  Cardiovascular: Normal rate, regular rhythm, normal heart sounds and intact distal pulses.  Exam reveals no gallop.   Pulmonary/Chest: Effort normal and breath sounds normal. No respiratory distress. He has no wheezes. He has no rales.  No crackles  Abdominal: Soft. Bowel sounds are normal. He exhibits no distension, no abdominal bruit and no mass. There is no tenderness.  Musculoskeletal: He exhibits no edema.  Lymphadenopathy:    He has no cervical adenopathy.  Neurological: He is alert. He has normal reflexes. He displays no tremor. No cranial nerve deficit. He exhibits normal muscle tone. Coordination normal.  Skin: Skin is warm and dry. No rash noted. No pallor.  Psychiatric: He has a normal mood and affect.          Assessment & Plan:   Problem List Items Addressed This Visit      Endocrine   Hypothyroidism - Primary      TSH is up  Lab Results  Component Value Date   TSH 6.21 (H) 09/30/2016    Inc dose of levothyroxine to 88 mcg Alert if problems Re check 6 wk  Taking it correctly      Relevant Medications   levothyroxine (SYNTHROID, LEVOTHROID) 88 MCG tablet     Other   Colon cancer screening    Refer for first screening colonoscopy      Relevant Orders   Ambulatory referral to  Gastroenterology   Elevated glucose level    110 fasting  Disc risk of dm disc imp of low glycemic diet and wt loss to prevent DM2 Enc further wt loss  A1C with lab in 6 wk Handouts given on prediabetes       Family history of prostate cancer    Reminded him to f/u with urology      Hyperlipidemia    Disc goals for lipids and reasons to control them Rev labs with pt Rev low sat fat diet in detail LDL is coming down with improved diet  Disc other bkfast options (besides sausage biscuit)        Obesity (BMI 30.0-34.9)    Discussed how this problem influences overall health and the risks it imposes  Reviewed plan for weight loss with lower calorie diet (via better food choices and also portion control or program like weight watchers) and exercise building up to or more than 30 minutes 5 days per week including some aerobic activity

## 2016-10-04 NOTE — Assessment & Plan Note (Addendum)
110 fasting  Disc risk of dm disc imp of low glycemic diet and wt loss to prevent DM2 Enc further wt loss  A1C with lab in 6 wk Handouts given on prediabetes

## 2016-10-04 NOTE — Assessment & Plan Note (Signed)
Refer for first screening colonoscopy

## 2016-10-04 NOTE — Patient Instructions (Addendum)
For cholesterol  Avoid red meat/ fried foods/ egg yolks/ fatty breakfast meats/ butter, cheese and high fat dairy/ and shellfish    Get most of your carbohydrates from produce instead of bread/pasta/rice/crackers (processed carbs)  Shop the outside of the supermarket instead of the middle   Keep walking  Increase thyroid dose to 88 mcg - will re check 6 weeks with a blood sugar test also   Don't forget to get to the urologist for annual visit   We will refer you for screening colonoscopy

## 2016-10-04 NOTE — Assessment & Plan Note (Signed)
Reminded him to f/u with urology

## 2016-10-04 NOTE — Assessment & Plan Note (Signed)
TSH is up  Lab Results  Component Value Date   TSH 6.21 (H) 09/30/2016    Inc dose of levothyroxine to 88 mcg Alert if problems Re check 6 wk  Taking it correctly

## 2016-10-14 ENCOUNTER — Other Ambulatory Visit: Payer: Self-pay | Admitting: Family Medicine

## 2016-10-14 MED ORDER — LEVOTHYROXINE SODIUM 88 MCG PO TABS: 88.0000 ug | ORAL_TABLET | Freq: Every day | ORAL | 5 refills | Status: DC

## 2016-11-21 ENCOUNTER — Encounter: Payer: 59 | Admitting: Family Medicine

## 2016-12-12 ENCOUNTER — Encounter: Payer: Self-pay | Admitting: Internal Medicine

## 2016-12-14 DIAGNOSIS — E291 Testicular hypofunction: Secondary | ICD-10-CM | POA: Diagnosis not present

## 2016-12-14 DIAGNOSIS — N400 Enlarged prostate without lower urinary tract symptoms: Secondary | ICD-10-CM | POA: Diagnosis not present

## 2016-12-28 DIAGNOSIS — M1712 Unilateral primary osteoarthritis, left knee: Secondary | ICD-10-CM | POA: Diagnosis not present

## 2016-12-28 DIAGNOSIS — M25562 Pain in left knee: Secondary | ICD-10-CM | POA: Diagnosis not present

## 2016-12-28 DIAGNOSIS — G8929 Other chronic pain: Secondary | ICD-10-CM | POA: Diagnosis not present

## 2017-01-02 ENCOUNTER — Ambulatory Visit (AMBULATORY_SURGERY_CENTER): Payer: Self-pay | Admitting: *Deleted

## 2017-01-02 VITALS — Ht 73.0 in | Wt 257.0 lb

## 2017-01-02 DIAGNOSIS — Z1211 Encounter for screening for malignant neoplasm of colon: Secondary | ICD-10-CM

## 2017-01-02 MED ORDER — NA SULFATE-K SULFATE-MG SULF 17.5-3.13-1.6 GM/177ML PO SOLN: ORAL | 0 refills | Status: DC

## 2017-01-02 NOTE — Progress Notes (Signed)
Patient denies any allergies to eggs or soy. Patient denies any problems with anesthesia/sedation. Patient denies any oxygen use at home and does not take any diet/weight loss medications. EMMI education assisgned to patient on colonoscopy, this was explained and instructions given to patient. 

## 2017-01-04 ENCOUNTER — Encounter: Payer: Self-pay | Admitting: Internal Medicine

## 2017-01-04 ENCOUNTER — Telehealth: Payer: Self-pay | Admitting: Internal Medicine

## 2017-01-04 NOTE — Telephone Encounter (Signed)
Faxed $15 off coupon to patient's pharmacy. Wife notified.

## 2017-01-10 NOTE — Telephone Encounter (Signed)
Pt wife calling back to state coupon did not drop the price of prep and need something else.

## 2017-01-10 NOTE — Telephone Encounter (Signed)
Spoke with pt. He states the coupon only dropped the Suprep to $50.00 and they can not afford this! The pt will come to the 4th floor today and pick up a [employer] , Lot #[id number], Exp 7/20. He will call back if he has further questions. Gwyndolyn Saxon

## 2017-01-16 ENCOUNTER — Ambulatory Visit (AMBULATORY_SURGERY_CENTER): Payer: 59 | Admitting: Internal Medicine

## 2017-01-16 ENCOUNTER — Encounter: Payer: Self-pay | Admitting: Internal Medicine

## 2017-01-16 VITALS — BP 111/72 | HR 69 | Temp 98.7°F | Resp 20 | Ht 73.0 in | Wt 257.0 lb

## 2017-01-16 DIAGNOSIS — D128 Benign neoplasm of rectum: Secondary | ICD-10-CM | POA: Diagnosis not present

## 2017-01-16 DIAGNOSIS — Z1211 Encounter for screening for malignant neoplasm of colon: Secondary | ICD-10-CM

## 2017-01-16 DIAGNOSIS — D120 Benign neoplasm of cecum: Secondary | ICD-10-CM | POA: Diagnosis not present

## 2017-01-16 DIAGNOSIS — D122 Benign neoplasm of ascending colon: Secondary | ICD-10-CM | POA: Diagnosis not present

## 2017-01-16 DIAGNOSIS — D129 Benign neoplasm of anus and anal canal: Secondary | ICD-10-CM

## 2017-01-16 MED ORDER — SODIUM CHLORIDE 0.9 % IV SOLN: 500.0000 mL | INTRAVENOUS | Status: DC

## 2017-01-16 NOTE — Progress Notes (Signed)
Report to PACU, RN, vss, BBS= Clear.  

## 2017-01-16 NOTE — Op Note (Signed)
[facility] Patient Name: Miguel Mendez Procedure Date: 01/16/2017 2:07 PM MRN: [id number] Endoscopist: Jerene Bears , MD Age: 61 Referring MD:  Date of Birth: November 18, 1955 Gender: Male Account #: [id number] Procedure:                Colonoscopy Indications:              Screening for colorectal malignant neoplasm, This                            is the patient's first colonoscopy Medicines:                Monitored Anesthesia Care Procedure:                Pre-Anesthesia Assessment:                           - Prior to the procedure, a History and Physical                            was performed, and patient medications and                            allergies were reviewed. The patient's tolerance of                            previous anesthesia was also reviewed. The risks                            and benefits of the procedure and the sedation                            options and risks were discussed with the patient.                            All questions were answered, and informed consent                            was obtained. Prior Anticoagulants: The patient has                            taken no previous anticoagulant or antiplatelet                            agents. ASA Grade Assessment: II - A patient with                            mild systemic disease. After reviewing the risks                            and benefits, the patient was deemed in                            satisfactory condition to undergo the procedure.  After obtaining informed consent, the colonoscope                            was passed under direct vision. Throughout the                            procedure, the patient's blood pressure, pulse, and                            oxygen saturations were monitored continuously. The                            Colonoscope was introduced through the anus and                            advanced to the the cecum,  identified by                            appendiceal orifice and ileocecal valve. The                            colonoscopy was performed without difficulty. The                            patient tolerated the procedure well. The quality                            of the bowel preparation was fair clearing to                            adequate with copious irrigation and lavage. The                            ileocecal valve, appendiceal orifice, and rectum                            were photographed. Scope In: 2:17:00 PM Scope Out: 2:35:07 PM Scope Withdrawal Time: 0 hours 16 minutes 24 seconds  Total Procedure Duration: 0 hours 18 minutes 7 seconds  Findings:                 The digital rectal exam was normal.                           A 2 mm polyp was found in the cecum. The polyp was                            sessile. The polyp was removed with a cold biopsy                            forceps. Resection and retrieval were complete.                           Three sessile polyps were found in the ascending  colon. The polyps were 3 to 7 mm in size. These                            polyps were removed with a cold snare. Resection                            and retrieval were complete.                           A 6 mm polyp was found in the rectum. The polyp was                            sessile. The polyp was removed with a cold snare.                            Resection and retrieval were complete.                           Multiple small and large-mouthed diverticula were                            found in the sigmoid colon and descending colon.                           Internal hemorrhoids were found during                            retroflexion. The hemorrhoids were small. Complications:            No immediate complications. Estimated Blood Loss:     Estimated blood loss was minimal. Impression:               - One 2 mm polyp in the cecum,  removed with a cold                            biopsy forceps. Resected and retrieved.                           - Three 3 to 7 mm polyps in the ascending colon,                            removed with a cold snare. Resected and retrieved.                           - One 6 mm polyp in the rectum, removed with a cold                            snare. Resected and retrieved.                           - Moderate diverticulosis in the sigmoid colon and  in the descending colon.                           - Internal hemorrhoids. Recommendation:           - Patient has a contact number available for                            emergencies. The signs and symptoms of potential                            delayed complications were discussed with the                            patient. Return to normal activities tomorrow.                            Written discharge instructions were provided to the                            patient.                           - Resume previous diet.                           - Continue present medications.                           - Await pathology results.                           - Repeat colonoscopy is recommended. The                            colonoscopy date will be determined after pathology                            results from today's exam become available for                            review. Jerene Bears, MD 01/16/2017 2:39:48 PM This report has been signed electronically.

## 2017-01-16 NOTE — Patient Instructions (Signed)
**   Handouts given on polyps, diverticulosis, and hemorrhoids **   YOU HAD AN ENDOSCOPIC PROCEDURE TODAY AT THE [facility]:   Refer to the procedure report that was given to you for any specific questions about what was found during the examination.  If the procedure report does not answer your questions, please call your gastroenterologist to clarify.  If you requested that your care partner not be given the details of your procedure findings, then the procedure report has been included in a sealed envelope for you to review at your convenience later.  YOU SHOULD EXPECT: Some feelings of bloating in the abdomen. Passage of more gas than usual.  Walking can help get rid of the air that was put into your GI tract during the procedure and reduce the bloating. If you had a lower endoscopy (such as a colonoscopy or flexible sigmoidoscopy) you may notice spotting of blood in your stool or on the toilet paper. If you underwent a bowel prep for your procedure, you may not have a normal bowel movement for a few days.  Please Note:  You might notice some irritation and congestion in your nose or some drainage.  This is from the oxygen used during your procedure.  There is no need for concern and it should clear up in a day or so.  SYMPTOMS TO REPORT IMMEDIATELY:   Following lower endoscopy (colonoscopy or flexible sigmoidoscopy):  Excessive amounts of blood in the stool  Significant tenderness or worsening of abdominal pains  Swelling of the abdomen that is new, acute  Fever of 100F or higher  For urgent or emergent issues, a gastroenterologist can be reached at any hour by calling [phone].   DIET:  We do recommend a small meal at first, but then you may proceed to your regular diet.  Drink plenty of fluids but you should avoid alcoholic beverages for 24 hours.  ACTIVITY:  You should plan to take it easy for the rest of today and you should NOT DRIVE or use heavy machinery until  tomorrow (because of the sedation medicines used during the test).    FOLLOW UP: Our staff will call the number listed on your records the next business day following your procedure to check on you and address any questions or concerns that you may have regarding the information given to you following your procedure. If we do not reach you, we will leave a message.  However, if you are feeling well and you are not experiencing any problems, there is no need to return our call.  We will assume that you have returned to your regular daily activities without incident.  If any biopsies were taken you will be contacted by phone or by letter within the next 1-3 weeks.  Please call us at [phone] if you have not heard about the biopsies in 3 weeks.    SIGNATURES/CONFIDENTIALITY: You and/or your care partner have signed paperwork which will be entered into your electronic medical record.  These signatures attest to the fact that that the information above on your After Visit Summary has been reviewed and is understood.  Full responsibility of the confidentiality of this discharge information lies with you and/or your care-partner. 

## 2017-01-16 NOTE — Progress Notes (Signed)
Called to room to assist during endoscopic procedure.  Patient ID and intended procedure confirmed with present staff. Received instructions for my participation in the procedure from the performing physician.  

## 2017-01-17 ENCOUNTER — Telehealth: Payer: Self-pay

## 2017-01-17 ENCOUNTER — Telehealth: Payer: Self-pay | Admitting: *Deleted

## 2017-01-17 NOTE — Telephone Encounter (Signed)
Called [phone] and left a messaged we tried to reach pt for a follow up call. maw

## 2017-01-17 NOTE — Telephone Encounter (Signed)
  Follow up Call-  Call back number 01/16/2017  Post procedure Call Back phone  # [phone]  Permission to leave phone message Yes  Some recent data might be hidden     Patient questions:  Do you have a fever, pain , or abdominal swelling? No. Pain Score  0 *  Have you tolerated food without any problems? Yes.    Have you been able to return to your normal activities? Yes.    Do you have any questions about your discharge instructions: Diet   No. Medications  No. Follow up visit  No.  Do you have questions or concerns about your Care? No.  Actions: * If pain score is 4 or above: No action needed, pain <4.

## 2017-01-19 ENCOUNTER — Encounter: Payer: Self-pay | Admitting: Internal Medicine

## 2017-02-08 DIAGNOSIS — M1712 Unilateral primary osteoarthritis, left knee: Secondary | ICD-10-CM | POA: Diagnosis not present

## 2017-02-18 DIAGNOSIS — Z23 Encounter for immunization: Secondary | ICD-10-CM | POA: Diagnosis not present

## 2017-05-05 ENCOUNTER — Other Ambulatory Visit: Payer: Self-pay | Admitting: Family Medicine

## 2017-05-05 NOTE — Telephone Encounter (Signed)
Left VM requesting pt to call the office back and schedule a lab appt to recheck TSH, CRM created

## 2017-05-05 NOTE — Telephone Encounter (Signed)
TSH level was elevated last check in June and it doesn't look like pt has had any recent labs since then, and no future appts., please advise

## 2017-05-05 NOTE — Telephone Encounter (Signed)
He needs TSH because we changed dose in June  Please schedule that and refill enough to get by Thanks

## 2017-05-09 ENCOUNTER — Other Ambulatory Visit (INDEPENDENT_AMBULATORY_CARE_PROVIDER_SITE_OTHER): Payer: 59

## 2017-05-09 DIAGNOSIS — R7309 Other abnormal glucose: Secondary | ICD-10-CM

## 2017-05-09 DIAGNOSIS — E039 Hypothyroidism, unspecified: Secondary | ICD-10-CM

## 2017-05-09 LAB — TSH: TSH: 3.16 u[IU]/mL (ref 0.35–4.50)

## 2017-05-09 LAB — HEMOGLOBIN A1C: HEMOGLOBIN A1C: 6.1 % (ref 4.6–6.5)

## 2017-05-09 NOTE — Telephone Encounter (Signed)
Pt had TSH checked today and it was normal, Rx refilled

## 2017-11-12 ENCOUNTER — Other Ambulatory Visit: Payer: Self-pay | Admitting: Family Medicine

## 2018-01-11 ENCOUNTER — Other Ambulatory Visit: Payer: Self-pay | Admitting: Family Medicine

## 2018-03-04 DIAGNOSIS — Z23 Encounter for immunization: Secondary | ICD-10-CM | POA: Diagnosis not present

## 2018-06-01 ENCOUNTER — Other Ambulatory Visit: Payer: Self-pay | Admitting: Family Medicine

## 2018-06-01 NOTE — Telephone Encounter (Signed)
Med refilled once and pt scheduled appt with Morey Hummingbird

## 2018-06-01 NOTE — Telephone Encounter (Signed)
Pt hasn't been seen in over a year and no future appt., please advise  

## 2018-06-01 NOTE — Telephone Encounter (Signed)
Please schedule f/u and refill until then  

## 2018-06-05 ENCOUNTER — Ambulatory Visit: Payer: 59 | Admitting: Family Medicine

## 2018-06-05 ENCOUNTER — Encounter: Payer: Self-pay | Admitting: Family Medicine

## 2018-06-05 VITALS — BP 122/78 | HR 71 | Temp 98.2°F | Ht 73.0 in | Wt 262.5 lb

## 2018-06-05 DIAGNOSIS — E039 Hypothyroidism, unspecified: Secondary | ICD-10-CM

## 2018-06-05 DIAGNOSIS — R7989 Other specified abnormal findings of blood chemistry: Secondary | ICD-10-CM | POA: Diagnosis not present

## 2018-06-05 DIAGNOSIS — E669 Obesity, unspecified: Secondary | ICD-10-CM | POA: Diagnosis not present

## 2018-06-05 DIAGNOSIS — E7800 Pure hypercholesterolemia, unspecified: Secondary | ICD-10-CM | POA: Diagnosis not present

## 2018-06-05 DIAGNOSIS — R7309 Other abnormal glucose: Secondary | ICD-10-CM

## 2018-06-05 DIAGNOSIS — Z8042 Family history of malignant neoplasm of prostate: Secondary | ICD-10-CM

## 2018-06-05 LAB — COMPREHENSIVE METABOLIC PANEL
ALBUMIN: 4.3 g/dL (ref 3.5–5.2)
ALT: 26 U/L (ref 0–53)
AST: 24 U/L (ref 0–37)
Alkaline Phosphatase: 63 U/L (ref 39–117)
BILIRUBIN TOTAL: 0.5 mg/dL (ref 0.2–1.2)
BUN: 17 mg/dL (ref 6–23)
CHLORIDE: 104 meq/L (ref 96–112)
CO2: 26 mEq/L (ref 19–32)
CREATININE: 1.21 mg/dL (ref 0.40–1.50)
Calcium: 9.1 mg/dL (ref 8.4–10.5)
GFR: 60.87 mL/min (ref >60.00)
Glucose, Bld: 92 mg/dL (ref 70–99)
Potassium: 4.4 mEq/L (ref 3.5–5.1)
SODIUM: 140 meq/L (ref 135–145)
Total Protein: 6.9 g/dL (ref 6.0–8.3)

## 2018-06-05 LAB — LIPID PANEL
CHOLESTEROL: 171 mg/dL (ref 0–200)
HDL: 43.8 mg/dL (ref >39.00)
LDL Cholesterol: 109 mg/dL — ABNORMAL HIGH (ref 0–99)
NonHDL: 127.04
TRIGLYCERIDES: 92 mg/dL (ref 0.0–149.0)
Total CHOL/HDL Ratio: 4
VLDL: 18.4 mg/dL (ref 0.0–40.0)

## 2018-06-05 LAB — TSH: TSH: 2.69 u[IU]/mL (ref 0.35–4.50)

## 2018-06-05 LAB — HEMOGLOBIN A1C: Hgb A1c MFr Bld: 6 % (ref 4.6–6.5)

## 2018-06-05 NOTE — Assessment & Plan Note (Signed)
Disc goals for lipids and reasons to control them Rev last labs with pt Rev low sat fat diet in detail Lipid panel today  Warned re: biscuits in diet

## 2018-06-05 NOTE — Assessment & Plan Note (Signed)
Overdue for urology visit  Ref done Will call pt to schedule No prostate symptoms

## 2018-06-05 NOTE — Progress Notes (Signed)
Subjective:    Patient ID: Miguel Mendez, male    DOB: 11/04/1955, 63 y.o.   MRN: [id number]  HPI  Here for f/u of hypothyroidism  Has been feeling pretty good  Working a lot   Taking care of himself   Wt Readings from Last 3 Encounters:  06/05/18 262 lb 8 oz (119.1 kg)  01/16/17 257 lb (116.6 kg)  01/02/17 257 lb (116.6 kg)  exercise - walks all day at work  Diet - eating healthy / avoids junk most of the time  34.63 kg/m   Hypothyroidism  Pt has no clinical changes No change in energy level/ hair or skin/ edema and no tremor Feels ok  Lab Results  Component Value Date   TSH 3.16 05/09/2017    Takes levothy 88 mcg daily   At one time glucose was 110 fasting Will add A1C today   Has family hx of prostate cancer  Was due in aug for f/u  Needs [place] to make appt  Nocturia 0-1 time  No trouble with flow or stream   Now father has bladder cancer as well    H/o hyperlipidemia Lab Results  Component Value Date   CHOL 186 09/30/2016   HDL 42.40 09/30/2016   LDLCALC 122 (H) 09/30/2016   LDLDIRECT 149.3 10/31/2007   TRIG 109.0 09/30/2016   CHOLHDL 4 09/30/2016   Doing well to avoid high cholesterol foods  occ biscuit - ? If lard based  No sausage or bacon occ hamburger   Patient Active Problem List   Diagnosis Date Noted  . Colon cancer screening 10/04/2016  . Elevated glucose level 10/04/2016  . Obesity (BMI 30.0-34.9) 10/04/2016  . Hypothyroidism 11/21/2013  . Family history of prostate cancer 10/30/2012  . OBSTRUCTIVE SLEEP APNEA 11/08/2007  . Low testosterone 10/30/2007  . Hyperlipidemia 10/30/2007   Past Medical History:  Diagnosis Date  . Acute upper respiratory infections of unspecified site   . Family history of malignant neoplasm of prostate   . Hyperlipidemia   . Other malaise and fatigue   . Other testicular hypofunction   . Sleep apnea    CPAP use  . Thyroid disease   . Tobacco use disorder    Past Surgical History:  Procedure  Laterality Date  . FINGER FRACTURE SURGERY Left    middle  . KNEE SURGERY Right   . SHOULDER SURGERY Bilateral    Social History   Tobacco Use  . Smoking status: Former Smoker    Types: Cigars  . Smokeless tobacco: Never Used  . Tobacco comment: quit 03/2013  Substance Use Topics  . Alcohol use: Yes    Alcohol/week: 0.0 standard drinks    Comment: Rare  . Drug use: No   Family History  Problem Relation Age of Onset  . Prostate cancer Father   . Coronary artery disease Father   . Prostate cancer Brother   . Coronary artery disease Maternal Grandfather   . Coronary artery disease Paternal Grandfather        in old age  . Diabetes Cousin   . Diabetes Son   . Colon cancer Paternal Uncle 56   Allergies  Allergen Reactions  . Bee Venom Swelling   Current Outpatient Medications on File Prior to Visit  Medication Sig Dispense Refill  . ibuprofen (ADVIL,MOTRIN) 200 MG tablet Take 200 mg by mouth as needed for pain.    Marland Kitchen levothyroxine (SYNTHROID, LEVOTHROID) 88 MCG tablet TAKE ONE TABLET BY MOUTH DAILY 30  tablet 0  . Multiple Vitamin (MULTIVITAMIN) tablet Take 1 tablet by mouth daily.      . NON FORMULARY CPAP      No current facility-administered medications on file prior to visit.     Review of Systems  Constitutional: Negative for activity change, appetite change, fatigue, fever and unexpected weight change.  HENT: Negative for congestion, rhinorrhea, sore throat and trouble swallowing.   Eyes: Negative for pain, redness, itching and visual disturbance.  Respiratory: Negative for cough, chest tightness, shortness of breath and wheezing.   Cardiovascular: Negative for chest pain and palpitations.  Gastrointestinal: Negative for abdominal pain, blood in stool, constipation, diarrhea and nausea.  Endocrine: Negative for cold intolerance, heat intolerance, polydipsia and polyuria.  Genitourinary: Negative for difficulty urinating, dysuria, frequency and urgency.    Musculoskeletal: Negative for arthralgias, joint swelling and myalgias.  Skin: Negative for pallor and rash.  Neurological: Negative for dizziness, tremors, weakness, numbness and headaches.  Hematological: Negative for adenopathy. Does not bruise/bleed easily.  Psychiatric/Behavioral: Negative for decreased concentration and dysphoric mood. The patient is not nervous/anxious.        Objective:   Physical Exam Constitutional:      General: He is not in acute distress.    Appearance: He is well-developed. He is obese. He is not ill-appearing.  HENT:     Head: Normocephalic and atraumatic.     Mouth/Throat:     Mouth: Mucous membranes are moist.     Pharynx: Oropharynx is clear.  Eyes:     General: No scleral icterus.    Conjunctiva/sclera: Conjunctivae normal.     Pupils: Pupils are equal, round, and reactive to light.  Neck:     Musculoskeletal: Normal range of motion and neck supple.     Thyroid: No thyromegaly.     Vascular: No carotid bruit or JVD.     Comments: No goiter Cardiovascular:     Rate and Rhythm: Normal rate and regular rhythm.     Heart sounds: Normal heart sounds. No murmur. No gallop.   Pulmonary:     Effort: Pulmonary effort is normal. No respiratory distress.     Breath sounds: Normal breath sounds. No wheezing or rales.     Comments: Good air exch Abdominal:     General: Abdomen is flat. There is no distension or abdominal bruit.     Palpations: Abdomen is soft.  Musculoskeletal:     Right lower leg: No edema.     Left lower leg: No edema.  Lymphadenopathy:     Cervical: No cervical adenopathy.  Skin:    General: Skin is warm and dry.     Coloration: Skin is not pale.     Findings: No rash.     Comments: Solar lentigines diffusely   Neurological:     General: No focal deficit present.     Mental Status: He is alert.     Motor: No tremor.     Deep Tendon Reflexes: Reflexes are normal and symmetric.     Comments: Nl DTRs  Psychiatric:         Mood and Affect: Mood normal.     Comments: Pleasant            Assessment & Plan:   Problem List Items Addressed This Visit      Endocrine   Hypothyroidism - Primary    Hypothyroidism  Pt has no clinical changes No change in energy level/ hair or skin/ edema and no tremor Lab Results  Component Value Date   TSH 3.16 05/09/2017    Due for TSH today -will adjust as needed       Relevant Orders   TSH     Other   Low testosterone    Due for urology f/u      Relevant Orders   Ambulatory referral to Urology   Hyperlipidemia    Disc goals for lipids and reasons to control them Rev last labs with pt Rev low sat fat diet in detail Lipid panel today  Warned re: biscuits in diet        Relevant Orders   Lipid panel   Comprehensive metabolic panel   Family history of prostate cancer    Overdue for urology visit  Ref done Will call pt to schedule No prostate symptoms       Relevant Orders   Ambulatory referral to Urology   Elevated glucose level    A1C today with labs 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

## 2018-06-05 NOTE — Patient Instructions (Addendum)
For cholesterol Avoid red meat/ fried foods/ egg yolks/ fatty breakfast meats/ butter, cheese and high fat dairy/ and shellfish    Our office will call you to set up your urology follow up   Glad you are doing well   Stay active and eat healthy

## 2018-06-05 NOTE — Assessment & Plan Note (Signed)
Discussed how this problem influences overall health and the risks it imposes  Reviewed plan for weight loss with lower calorie diet (via better food choices and also portion control or program like weight watchers) and exercise building up to or more than 30 minutes 5 days per week including some aerobic activity    

## 2018-06-05 NOTE — Assessment & Plan Note (Signed)
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

## 2018-06-05 NOTE — Assessment & Plan Note (Signed)
Hypothyroidism  Pt has no clinical changes No change in energy level/ hair or skin/ edema and no tremor Lab Results  Component Value Date   TSH 3.16 05/09/2017    Due for TSH today -will adjust as needed

## 2018-06-05 NOTE — Assessment & Plan Note (Signed)
Due for urology f/u

## 2018-06-06 ENCOUNTER — Other Ambulatory Visit: Payer: Self-pay | Admitting: *Deleted

## 2018-06-06 MED ORDER — LEVOTHYROXINE SODIUM 88 MCG PO TABS: 88.0000 ug | ORAL_TABLET | Freq: Every day | ORAL | 3 refills | Status: DC

## 2018-06-27 DIAGNOSIS — E291 Testicular hypofunction: Secondary | ICD-10-CM | POA: Diagnosis not present

## 2018-06-27 DIAGNOSIS — N400 Enlarged prostate without lower urinary tract symptoms: Secondary | ICD-10-CM | POA: Diagnosis not present

## 2018-07-04 DIAGNOSIS — E291 Testicular hypofunction: Secondary | ICD-10-CM | POA: Diagnosis not present

## 2018-07-04 DIAGNOSIS — N400 Enlarged prostate without lower urinary tract symptoms: Secondary | ICD-10-CM | POA: Diagnosis not present

## 2019-07-02 ENCOUNTER — Other Ambulatory Visit: Payer: Self-pay | Admitting: Family Medicine

## 2019-12-19 ENCOUNTER — Telehealth: Payer: Self-pay | Admitting: Family Medicine

## 2019-12-19 ENCOUNTER — Encounter: Payer: Self-pay | Admitting: Family Medicine

## 2019-12-19 ENCOUNTER — Other Ambulatory Visit: Payer: Self-pay

## 2019-12-19 ENCOUNTER — Ambulatory Visit: Payer: 59 | Admitting: Family Medicine

## 2019-12-19 DIAGNOSIS — R413 Other amnesia: Secondary | ICD-10-CM

## 2019-12-19 DIAGNOSIS — F0280 Dementia in other diseases classified elsewhere without behavioral disturbance: Secondary | ICD-10-CM | POA: Insufficient documentation

## 2019-12-19 LAB — COMPREHENSIVE METABOLIC PANEL
ALT: 23 U/L (ref 0–53)
AST: 20 U/L (ref 0–37)
Albumin: 4.2 g/dL (ref 3.5–5.2)
Alkaline Phosphatase: 60 U/L (ref 39–117)
BUN: 10 mg/dL (ref 6–23)
CO2: 25 mEq/L (ref 19–32)
Calcium: 9.4 mg/dL (ref 8.4–10.5)
Chloride: 108 mEq/L (ref 96–112)
Creatinine, Ser: 1.19 mg/dL (ref 0.40–1.50)
GFR: 61.74 mL/min (ref >60.00)
Glucose, Bld: 108 mg/dL — ABNORMAL HIGH (ref 70–99)
Potassium: 4.2 mEq/L (ref 3.5–5.1)
Sodium: 140 mEq/L (ref 135–145)
Total Bilirubin: 0.7 mg/dL (ref 0.2–1.2)
Total Protein: 6.6 g/dL (ref 6.0–8.3)

## 2019-12-19 LAB — LIPID PANEL
Cholesterol: 155 mg/dL (ref 0–200)
HDL: 40.9 mg/dL (ref >39.00)
LDL Cholesterol: 83 mg/dL (ref 0–99)
NonHDL: 114.17
Total CHOL/HDL Ratio: 4
Triglycerides: 158 mg/dL — ABNORMAL HIGH (ref 0.0–149.0)
VLDL: 31.6 mg/dL (ref 0.0–40.0)

## 2019-12-19 LAB — CBC WITH DIFFERENTIAL/PLATELET
Basophils Absolute: 0.1 10*3/uL (ref 0.0–0.1)
Basophils Relative: 1.3 % (ref 0.0–3.0)
Eosinophils Absolute: 0.1 10*3/uL (ref 0.0–0.7)
Eosinophils Relative: 1.5 % (ref 0.0–5.0)
HCT: 43.2 % (ref 39.0–52.0)
Hemoglobin: 14.8 g/dL (ref 13.0–17.0)
Lymphocytes Relative: 24.9 % (ref 12.0–46.0)
Lymphs Abs: 1.7 10*3/uL (ref 0.7–4.0)
MCHC: 34.2 g/dL (ref 30.0–36.0)
MCV: 96.2 fl (ref 78.0–100.0)
Monocytes Absolute: 0.6 10*3/uL (ref 0.1–1.0)
Monocytes Relative: 8.9 % (ref 3.0–12.0)
Neutro Abs: 4.4 10*3/uL (ref 1.4–7.7)
Neutrophils Relative %: 63.4 % (ref 43.0–77.0)
Platelets: 194 10*3/uL (ref 150.0–400.0)
RBC: 4.49 Mil/uL (ref 4.22–5.81)
RDW: 12.4 % (ref 11.5–15.5)
WBC: 7 10*3/uL (ref 4.0–10.5)

## 2019-12-19 LAB — TSH: TSH: 2 u[IU]/mL (ref 0.35–4.50)

## 2019-12-19 LAB — VITAMIN B12: Vitamin B-12: 744 pg/mL (ref 211–911)

## 2019-12-19 NOTE — Telephone Encounter (Signed)
Labs are ok  I placed a neurology referral for memory problems and will send to [facility]

## 2019-12-19 NOTE — Progress Notes (Signed)
Subjective:    Patient ID: Miguel Mendez, male    DOB: 10/13/1955, 64 y.o.   MRN: [id number]  This visit occurred during the SARS-CoV-2 public health emergency.  Safety protocols were in place, including screening questions prior to the visit, additional usage of staff PPE, and extensive cleaning of exam room while observing appropriate contact time as indicated for disinfecting solutions.    HPI  Pt presents for memory concerns  Wt Readings from Last 3 Encounters:  12/19/19 248 lb 8 oz (112.7 kg)  06/05/18 262 lb 8 oz (119.1 kg)  01/16/17 257 lb (116.6 kg)   32.79 kg/m   Per wife- started 3-4 y ago and worsening  Early on was sporatic Now more often with more things   Sometimes has to count numbers when looking at watch  One day he could not remember what time he was supposed to get up  Normal phone /computer functions are starting to confuse him  Art therapist has noted also   Pt notes he often misplaces things  Has not become lost  He at times knows he is confused  Has to make more reminders   No new medicines  No change in diet - but perhaps eating less and less sweets  Does not think he forgets meals  No exercise   No illicit drugs  etoh - none 2-3 years  No head injuries in his lifetime   No mood changes lately  A little less motivation to detail his cars  Enjoys surfing the web  He does not do finances -now wife helps (not due to memory)  Not down or depressed or hopeless    Mother and her sibs died with alzheimer's dementia   Is immunized for covid  Has not had covid illness  Patient Active Problem List   Diagnosis Date Noted  . Memory change 12/19/2019  . Colon cancer screening 10/04/2016  . Elevated glucose level 10/04/2016  . Obesity (BMI 30.0-34.9) 10/04/2016  . Hypothyroidism 11/21/2013  . Family history of prostate cancer 10/30/2012  . OBSTRUCTIVE SLEEP APNEA 11/08/2007  . Low testosterone 10/30/2007  . Hyperlipidemia 10/30/2007   Past  Medical History:  Diagnosis Date  . Acute upper respiratory infections of unspecified site   . Family history of malignant neoplasm of prostate   . Hyperlipidemia   . Other malaise and fatigue   . Other testicular hypofunction   . Sleep apnea    CPAP use  . Thyroid disease   . Tobacco use disorder    Past Surgical History:  Procedure Laterality Date  . FINGER FRACTURE SURGERY Left    middle  . KNEE SURGERY Right   . SHOULDER SURGERY Bilateral    Social History   Tobacco Use  . Smoking status: Former Smoker    Types: Cigars  . Smokeless tobacco: Never Used  . Tobacco comment: quit 03/2013  Vaping Use  . Vaping Use: Never used  Substance Use Topics  . Alcohol use: Yes    Alcohol/week: 0.0 standard drinks    Comment: Rare  . Drug use: No   Family History  Problem Relation Age of Onset  . Prostate cancer Father   . Coronary artery disease Father   . Bladder Cancer Father   . Prostate cancer Brother   . Coronary artery disease Maternal Grandfather   . Coronary artery disease Paternal Grandfather        in old age  . Diabetes Cousin   . Diabetes Son   .  Colon cancer Paternal Uncle 58   Allergies  Allergen Reactions  . Bee Venom Swelling   Current Outpatient Medications on File Prior to Visit  Medication Sig Dispense Refill  . levothyroxine (SYNTHROID) 88 MCG tablet TAKE ONE TABLET BY MOUTH DAILY BEFORE BREAKFAST 90 tablet 2  . Multiple Vitamin (MULTIVITAMIN) tablet Take 1 tablet by mouth daily.      . Naproxen Sodium (ALEVE PO) Take by mouth daily.    . NON FORMULARY CPAP      No current facility-administered medications on file prior to visit.     Review of Systems  Constitutional: Negative for activity change, appetite change, fatigue, fever and unexpected weight change.  HENT: Negative for congestion, rhinorrhea, sore throat and trouble swallowing.   Eyes: Negative for pain, redness, itching and visual disturbance.  Respiratory: Negative for cough, chest  tightness, shortness of breath and wheezing.   Cardiovascular: Negative for chest pain and palpitations.  Gastrointestinal: Negative for abdominal pain, blood in stool, constipation, diarrhea and nausea.  Endocrine: Negative for cold intolerance, heat intolerance, polydipsia and polyuria.  Genitourinary: Negative for difficulty urinating, dysuria, frequency and urgency.  Musculoskeletal: Negative for arthralgias, joint swelling and myalgias.  Skin: Negative for pallor and rash.  Neurological: Negative for dizziness, tremors, weakness, numbness and headaches.  Hematological: Negative for adenopathy. Does not bruise/bleed easily.  Psychiatric/Behavioral: Positive for confusion and decreased concentration. Negative for agitation, dysphoric mood, hallucinations, self-injury and sleep disturbance. The patient is not nervous/anxious.        Objective:   Physical Exam Constitutional:      General: He is not in acute distress.    Appearance: Normal appearance. He is obese. He is not ill-appearing or diaphoretic.  HENT:     Mouth/Throat:     Mouth: Mucous membranes are moist.  Eyes:     General: No scleral icterus.    Conjunctiva/sclera: Conjunctivae normal.     Pupils: Pupils are equal, round, and reactive to light.  Neck:     Vascular: No carotid bruit.  Cardiovascular:     Rate and Rhythm: Normal rate and regular rhythm.  Pulmonary:     Effort: Pulmonary effort is normal. No respiratory distress.  Musculoskeletal:     Cervical back: Normal range of motion and neck supple.     Right lower leg: No edema.     Left lower leg: No edema.  Skin:    Coloration: Skin is not pale.     Findings: No erythema or rash.  Neurological:     Mental Status: He is alert.     Cranial Nerves: No cranial nerve deficit.     Motor: No weakness.     Coordination: Coordination normal.     Gait: Gait normal.     Deep Tendon Reflexes: Reflexes normal.  Psychiatric:        Mood and Affect: Mood normal.         Speech: Speech normal.        Behavior: Behavior normal.        Cognition and Memory: He exhibits impaired recent memory.     Comments: Normal mood  Quiet affect - answers questions appropriately   MMS exam score 23  Slowed cognition -mild and some imp in short term recall           Assessment & Plan:   Problem List Items Addressed This Visit      Other   Memory change    And cognitive change - worse in  the past several mon noticed by wife and co workers MMS score 23 today and clock draw also bunched numbers on L side  Having both short term memory and cognitive changes including confusion (he does notice himself) fam hx of alz in mother and her sibs  No indication of depression , drug or alcohol dependence and no changes neurologically  Lab today for cmet,cbc,tsh, B12 , lipid  If no metabolic suspicion would consider neuro ref (given age) to discuss possible early dementia        Relevant Orders   CBC with Differential/Platelet   Comprehensive metabolic panel   Lipid panel   TSH   Vitamin B12

## 2019-12-19 NOTE — Assessment & Plan Note (Signed)
And cognitive change - worse in the past several mon noticed by wife and co workers MMS score 23 today and clock draw also bunched numbers on L side  Having both short term memory and cognitive changes including confusion (he does notice himself) fam hx of alz in mother and her sibs  No indication of depression , drug or alcohol dependence and no changes neurologically  Lab today for cmet,cbc,tsh, B12 , lipid  If no metabolic suspicion would consider neuro ref (given age) to discuss possible early dementia

## 2019-12-19 NOTE — Patient Instructions (Addendum)
Let's check labs today to see if there is any metabolic abnormality to affect memory   If all normal -I will refer to neurology   Read  Do puzzles  Socialize  Work on computer Do some math  Eat a heathy balanced diet and drink fluids

## 2019-12-20 ENCOUNTER — Encounter: Payer: Self-pay | Admitting: Neurology

## 2019-12-20 NOTE — Telephone Encounter (Signed)
Per prev message Terri Skains has left lab info on VM

## 2019-12-20 NOTE — Telephone Encounter (Signed)
Left detailed message on voicemail. DPR 

## 2019-12-20 NOTE — Telephone Encounter (Signed)
Spoke with patient - he is in the process of getting scheduled for his Neurology appt with Dr Delice Lesch with [facility]   Will send back to [facility] to follow up on lab results.

## 2020-01-09 ENCOUNTER — Encounter: Payer: Self-pay | Admitting: Family Medicine

## 2020-01-09 DIAGNOSIS — R413 Other amnesia: Secondary | ICD-10-CM

## 2020-01-22 NOTE — Telephone Encounter (Signed)
Dr. Glori Bickers, since previous referral is already associated with another appt, I will need a new referral placed to send to Dr. Melrose Nakayama or Dr. Trena Platt office. Thank you!

## 2020-01-22 NOTE — Telephone Encounter (Signed)
See below

## 2020-01-22 NOTE — Telephone Encounter (Signed)
Done

## 2020-01-28 ENCOUNTER — Other Ambulatory Visit (INDEPENDENT_AMBULATORY_CARE_PROVIDER_SITE_OTHER): Payer: 59

## 2020-01-28 ENCOUNTER — Ambulatory Visit: Payer: 59 | Admitting: Neurology

## 2020-01-28 ENCOUNTER — Encounter: Payer: Self-pay | Admitting: Neurology

## 2020-01-28 ENCOUNTER — Other Ambulatory Visit: Payer: Self-pay

## 2020-01-28 VITALS — BP 127/75 | HR 82 | Ht 73.0 in | Wt 250.2 lb

## 2020-01-28 DIAGNOSIS — R413 Other amnesia: Secondary | ICD-10-CM

## 2020-01-28 LAB — AMMONIA: Ammonia: 34 umol/L (ref 11–35)

## 2020-01-28 LAB — C-REACTIVE PROTEIN: CRP: <1 mg/dL (ref 0.5–20.0)

## 2020-01-28 LAB — SEDIMENTATION RATE: Sed Rate: 2 mm/hr (ref 0–20)

## 2020-01-28 MED ORDER — DONEPEZIL HCL 10 MG PO TABS: ORAL_TABLET | ORAL | 11 refills | Status: DC

## 2020-01-28 NOTE — Patient Instructions (Addendum)
1. Bloodwork for RPR, ammonia, ESR, CRP, ANA, anti-thyroglobulin antibodies, anti-thyroid peroxidase antibodies  2. Schedule MRI brain with and without contrast  3. Schedule Neurocognitive testing  4. Schedule spinal tap  5. Start Donepezil 58m: take 1/2 tablet daily for 2 weeks, then increase to 1 tablet daily  6. Follow-up after tests, call for any changes   FALL PRECAUTIONS: Be cautious when walking. Scan the area for obstacles that may increase the risk of trips and falls. When getting up in the mornings, sit up at the edge of the bed for a few minutes before getting out of bed. Consider elevating the bed at the head end to avoid drop of blood pressure when getting up. Walk always in a well-lit room (use night lights in the walls). Avoid area rugs or power cords from appliances in the middle of the walkways. Use a walker or a cane if necessary and consider physical therapy for balance exercise. Get your eyesight checked regularly.  FINANCIAL OVERSIGHT: Supervision, especially oversight when making financial decisions or transactions is also recommended.  HOME SAFETY: Consider the safety of the kitchen when operating appliances like stoves, microwave oven, and blender. Consider having supervision and share cooking responsibilities until no longer able to participate in those. Accidents with firearms and other hazards in the house should be identified and addressed as well.  DRIVING: Regarding driving, in patients with progressive memory problems, driving will be impaired. We advise to have someone else do the driving if trouble finding directions or if minor accidents are reported. Independent driving assessment is available to determine safety of driving.  ABILITY TO BE LEFT ALONE: If patient is unable to contact 911 operator, consider using LifeLine, or when the need is there, arrange for someone to stay with patients. Smoking is a fire hazard, consider supervision or cessation. Risk of  wandering should be assessed by caregiver and if detected at any point, supervision and safe proof recommendations should be instituted.  MEDICATION SUPERVISION: Inability to self-administer medication needs to be constantly addressed. Implement a mechanism to ensure safe administration of the medications.  RECOMMENDATIONS FOR ALL PATIENTS WITH MEMORY PROBLEMS: 1. Continue to exercise (Recommend 30 minutes of walking everyday, or 3 hours every week) 2. Increase social interactions - continue going to [place] enjoy social gatherings with friends and family 3. Eat healthy, avoid fried foods and eat more fruits and vegetables 4. Maintain adequate blood pressure, blood sugar, and blood cholesterol level. Reducing the risk of stroke and cardiovascular disease also helps promoting better memory. 5. Avoid stressful situations. Live a simple life and avoid aggravations. Organize your time and prepare for the next day in anticipation. 6. Sleep well, avoid any interruptions of sleep and avoid any distractions in the bedroom that may interfere with adequate sleep quality 7. Avoid sugar, avoid sweets as there is a strong link between excessive sugar intake, diabetes, and cognitive impairment We discussed the Mediterranean diet, which has been shown to help patients reduce the risk of progressive memory disorders and reduces cardiovascular risk. This includes eating fish, eat fruits and green leafy vegetables, nuts like almonds and hazelnuts, walnuts, and also use olive oil. Avoid fast foods and fried foods as much as possible. Avoid sweets and sugar as sugar use has been linked to worsening of memory function.  There is always a concern of gradual progression of memory problems. If this is the case, then we may need to adjust level of care according to patient needs. Support, both to the  patient and caregiver, should then be put into place.
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
## 2020-02-02 LAB — THYROGLOBULIN LEVEL: Thyroglobulin: 8.7 ng/mL

## 2020-02-02 LAB — ANA: Anti Nuclear Antibody (ANA): NEGATIVE

## 2020-02-02 LAB — RPR: RPR Ser Ql: NONREACTIVE

## 2020-02-02 LAB — THYROID PEROXIDASE ANTIBODY: Thyroperoxidase Ab SerPl-aCnc: 25 IU/mL — ABNORMAL HIGH (ref <9)

## 2020-02-05 ENCOUNTER — Telehealth: Payer: Self-pay

## 2020-02-05 NOTE — Telephone Encounter (Signed)
-----   Message from Cameron Sprang, MD sent at 02/05/2020 10:00 AM EDT ----- Pls let him know bloodwork was unremarkable, proceed with memory testing as scheduled. Thanks

## 2020-02-05 NOTE — Telephone Encounter (Signed)
Pt called and informed that bloodwork was unremarkable, proceed with memory testing as scheduled

## 2020-02-20 ENCOUNTER — Other Ambulatory Visit: Payer: Self-pay

## 2020-02-20 ENCOUNTER — Ambulatory Visit
Admission: RE | Admit: 2020-02-20 | Discharge: 2020-02-20 | Disposition: A | Discharge status: Home / Self Care | Payer: 59 | Source: Ambulatory Visit | Attending: Neurology | Admitting: Neurology

## 2020-02-20 DIAGNOSIS — R413 Other amnesia: Secondary | ICD-10-CM

## 2020-02-20 MED ORDER — GADOBENATE DIMEGLUMINE 529 MG/ML IV SOLN
20.0000 mL | Freq: Once | INTRAVENOUS | Status: AC | PRN
  Administered 2020-02-20: 20 mL via INTRAVENOUS

## 2020-02-24 ENCOUNTER — Ambulatory Visit
Admission: RE | Admit: 2020-02-24 | Discharge: 2020-02-24 | Disposition: A | Discharge status: Home / Self Care | Payer: 59 | Source: Ambulatory Visit | Attending: Neurology | Admitting: Neurology

## 2020-02-24 ENCOUNTER — Other Ambulatory Visit: Payer: Self-pay

## 2020-02-24 ENCOUNTER — Other Ambulatory Visit (HOSPITAL_COMMUNITY)
Admission: RE | Admit: 2020-02-24 | Discharge: 2020-02-24 | Disposition: A | Discharge status: Home / Self Care | Payer: 59 | Source: Ambulatory Visit | Attending: Neurology | Admitting: Neurology

## 2020-02-24 ENCOUNTER — Other Ambulatory Visit: Payer: Self-pay | Admitting: Neurology

## 2020-02-24 VITALS — BP 119/79 | HR 70

## 2020-02-24 DIAGNOSIS — R413 Other amnesia: Secondary | ICD-10-CM | POA: Insufficient documentation

## 2020-02-24 LAB — TIQ-MISC

## 2020-02-24 LAB — CSF CELL COUNT WITH DIFFERENTIAL
RBC Count, CSF: 24 cells/uL — ABNORMAL HIGH
RBC Count, CSF: 24 cells/uL — ABNORMAL HIGH
WBC, CSF: 1 cells/uL (ref 0–5)
WBC, CSF: 1 cells/uL (ref 0–5)

## 2020-02-24 NOTE — Progress Notes (Addendum)
2 red-top tubes of blood drawn from right [facility] space for LP labs; site unremarkable.  Discharge instructions explained to patient.

## 2020-02-24 NOTE — Discharge Instructions (Signed)

## 2020-02-25 LAB — CYTOLOGY - NON PAP

## 2020-03-02 LAB — CSF CULTURE W GRAM STAIN
MICRO NUMBER:: 11142744
Result:: NO GROWTH
SPECIMEN QUALITY:: ADEQUATE

## 2020-03-02 LAB — PROTEIN, CSF: Total Protein, CSF: 38 mg/dL (ref 15–60)

## 2020-03-02 LAB — CRYPTOCOCCAL AG, LTX SCR RFLX TITER
Cryptococcal Ag Screen: NOT DETECTED
MICRO NUMBER:: 11142743
SPECIMEN QUALITY:: ADEQUATE

## 2020-03-02 LAB — GLUCOSE, CSF: Glucose, CSF: 59 mg/dL (ref 40–80)

## 2020-03-02 LAB — OLIGOCLONAL BANDS, CSF + SERM

## 2020-03-02 LAB — ANGIOTENSIN CONVERTING ENZYME, CSF: ACE, CSF: 3 U/L (ref <=15)

## 2020-03-02 LAB — VDRL, CSF: VDRL Quant, CSF: NONREACTIVE

## 2020-03-05 ENCOUNTER — Ambulatory Visit: Payer: 59

## 2020-03-05 ENCOUNTER — Encounter: Payer: Self-pay | Admitting: Counselor

## 2020-03-05 ENCOUNTER — Other Ambulatory Visit: Payer: Self-pay

## 2020-03-05 ENCOUNTER — Ambulatory Visit (INDEPENDENT_AMBULATORY_CARE_PROVIDER_SITE_OTHER): Payer: 59 | Admitting: Counselor

## 2020-03-05 DIAGNOSIS — F09 Unspecified mental disorder due to known physiological condition: Secondary | ICD-10-CM

## 2020-03-05 DIAGNOSIS — R454 Irritability and anger: Secondary | ICD-10-CM

## 2020-03-05 DIAGNOSIS — G319 Degenerative disease of nervous system, unspecified: Secondary | ICD-10-CM

## 2020-03-05 DIAGNOSIS — R453 Demoralization and apathy: Secondary | ICD-10-CM

## 2020-03-05 NOTE — Progress Notes (Signed)
   Psychometrist Note   Cognitive testing was administered to Miguel Mendez by Lamar Benes, B.S. (Technician) under the supervision of Alphonzo Severance, Psy.D., ABN.   [place] was able to tolerate all test procedures. Dr. Nicole Kindred met with the patient as needed to manage any emotional reactions to the testing procedures. Rest breaks were offered.    The battery of tests administered was selected by Dr. Nicole Kindred with consideration to the patient's current level of functioning, the nature of [name]'s symptoms, emotional and behavioral responses during the interview, level of literacy, observed level of motivation/effort, and the nature of the referral question. This battery was communicated to the psychometrist. Communication between Dr. Nicole Kindred and the psychometrist was ongoing throughout the evaluation and Dr. Nicole Kindred was immediately accessible at all times. Dr. Nicole Kindred provided supervision to the technician on the date of this service, to the extent necessary to assure the quality of all services provided.      Aboud will return in approximately one week for an interactive feedback session with Dr. Nicole Kindred, at which time test performance, clinical impressions, and treatment recommendations will be reviewed in detail. The patient understands [name] can contact our office should [name] require our assistance before this time.   A total of 110 minutes of billable time were spent with Miguel Mendez by the technician, including test administration and scoring time. Billing for these services is reflected in Dr. Les Pou note.   This note reflects time spent with the psychometrician and does not include test scores, clinical history, or any interpretations made by Dr. Nicole Kindred. The full report will follow in a separate note.

## 2020-03-05 NOTE — Progress Notes (Signed)
[facility]  Patient Name: Miguel Mendez MRN: [id number] Date of Birth: 1955/10/31 Age: 64 y.o. Education: 12 years  Referral Circumstances and Background Information    Miguel Mendez is a 65 y.o., right-hand dominant, married man with a history of OSA, hypothyroidismn, HLD and memory loss/personality changes for several years. He has a strong family history of dementia on his mother's side. He was seen recently by Dr. Delice Lesch who demonstrated a MoCA of 12/30, started donepezil, and was most concerned about FTD. My thinks to Dr. Delice Lesch for her very thorough workup, including an LP that has not come back yet.    On interview, the told me as he told Dr. Delice Lesch "sometimes I forget things but then they come back," and he didn't present as particularly concerned. He thinks it's "normal" change. His wife stated that the first change noticed was him overreacting with anger to things. He has always been "a little grumpy" but this was a significant change. That started as far back as 7 or 8 years ago. She reported that the anger has gotten somewhat worse, although not a lot. He has verbal anger and no physical aggressiveness, he is not breaking things, and there are no safety concerns. He also started giving up hobbies and activities, such as detailing vehicles (which he loved doing every weekend) and that stopped around 5 or 6 years ago. He was always good about fixing things around the house and no longer does that. He tends to start a project and then won't finish. At this point, there is profound apathy, and if he isn't at work he is doing something on the computer or watching TV, which has been over the past 3 or 4 years. She is denying using rough language in public, coarsening of manners, or inappropriate social behaviors in the community. She is not noticing any stereotyped or repetitive behaviors. He has no compulsiveness and in fact, he has always been very  regimented and he is less so now. He has always had a penchant for sweets, even as a little child if there was a candy bar in the house, he would "sniff it out" and there are no changes in that. His wife is denying any inattentiveness to hygiene, needing to prompt him to take showers, or change his clothes. There is some malodorousness during the interview, however, suggesting that may not be the case.   With respect to memory and thinking changes, he is having a hard time, as detailed extensively in doctor Aquino's note, he has a hard time using his smart phone and he is forgetting how to do things on the computer that he taught other people to do at work. He has memory loss and will repeat himself, which has gotten "drastically worse" over the past 4 or 5 months. The patient's wife reported that that he asked about the appointment 5 or 6 times, when it was, what tests he had to do. She thinks it is genuine forgetting and not simply repetitive behavior motivated by anxiety. When I walked her out at the end of the interview and thanked her for her candidness, she commented "don't worry, he'll forget the whole thing ever happened." He has some minor word finding problems and semantic paraphasic errors (he called the dog lead a hose instead of a lead), although he has no very prominent language symptoms. He has difficulties with multi step processes, such as completing complicated household tasks, etc. His wife needs to  give him things one step at a time. Even several years ago, he "couldn't grasp" the instructions for his colonoscopy and implement them in the right order. He replaced a water heater about 3 years ago and just sat on the floor looking at it, looking lost. They had to get a neighbor to come help. He has replaced their water heater several times in the past.   He has some minor tremors as described in doctor Aquino's note and he also has some minor issues with fine dexterity, such as doing buttons.  He has no gait changes, no falls, no problems with skilled motor behaviors such as using silverware or tying his shoes, and no handwriting changes. There may be visual hallucinations, there was one instance where he saw his mother at work (she is dead) and described her in great detail. He said that he saw her and then looked back and she was gone. He has some visual illusions vs. pareidolia, he will think the cat is in the neighbors yard when it's a bag or something else. His wife can easily identify it is not an animal. This is over the past 6 months. There are no auditory hallucinations, mistaken beliefs, or delusions.   With respect to mood, the patient reported that he feels fine and denied feeling upset or worried about the things that were being discussed in the interview. His energy is normal. His appetite is good, although he has lost some weight. He says he has been doing a lot of walking at work. He stated that he is sleeping adequately, typically around 7 or 8 hours. Sometimes, he will sleep until 1pm or 2pm on the weekends, which is new over the past 3 years. He goes to bed at 9pm. There is possible dream enactment behavior, although that has been going on for the entire time they have been together, over 20 years. Sometimes, he will slap the side of the bed repeatedly when he sleeps near the side of the bed, he has flung the cat off the bed, and he will roll around. He doesn't talk or scream.   With respect to functioning, the patients wife has always managed things, although he has no problems handling sums of money, paying for things, or the like. He usually uses a debit card but doesn't buy much so his use is limited. His wife thinks that his judgment and problem solving is impaired, even something as simple as deciding whether he wants pizza or chicken for dinner can take 15 minutes. He defers most decisions to her. He is still working, as a Engineer, agricultural, and increasing problems have been noticed  over the past 5 months, although hs wife wasn't aware of specifics. His coworkers have said they don't feel he is a danger to himself or others. They have been discussing retirement, although they haven't come to a conclusion. There have been some changes with driving, as detailed in doctor Aquino's note, he was confused when making a left turn on a yellow light. He has also had some difficulties with parking near the side of the house. There is no wreckless behavior or problems with lane placement and there are no accidents. He doesn't have much in the way of behaviors in the community, he likes to stay at home. He will go grocery shopping, his wife doesn't think that she could drop him off at the grocery store and have him get a list of items, he would have a hard time. There are  no changes in attentiveness to hygiene, as above.   Past Medical History and Review of Relevant Studies   Patient Active Problem List   Diagnosis Date Noted  . Memory change 12/19/2019  . Colon cancer screening 10/04/2016  . Elevated glucose level 10/04/2016  . Obesity (BMI 30.0-34.9) 10/04/2016  . Hypothyroidism 11/21/2013  . Family history of prostate cancer 10/30/2012  . OBSTRUCTIVE SLEEP APNEA 11/08/2007  . Low testosterone 10/30/2007  . Hyperlipidemia 10/30/2007   Review of Neuroimaging and Relevant Medical History: The patient denied any history of significant head injuries, neurosurgeries, strokes, seizures, or CNS infections.   He has neuroimaging that unfortunately is rather nonspecific. The images show generalized volume loss, with a bit more on the right than left. I concur with radiology that there is no particular lobar predominance although there may be just a bit more in the left temporal lobe. Midbrain volume is intact and there is minimal leukoaraiosis. The imaging is nonspecific but might raise concern about conditions involving diffuse atrophy. It is not consistent with expectations in the setting of  classic presentations of Pick's disease, semantic (e.g., TDP-C), logopenic, or agrammatic PPA. It is inconsistent with MAPT mutation.   Current Outpatient Medications  Medication Sig Dispense Refill  . donepezil (ARICEPT) 10 MG tablet Take 1/2 tablet daily for 2 weeks, then increase to 1 tablet daily 30 tablet 11  . Glucosamine-Chondroitin (OSTEO BI-FLEX REGULAR STRENGTH PO) Take by mouth. 2 a day    . levothyroxine (SYNTHROID) 88 MCG tablet TAKE ONE TABLET BY MOUTH DAILY BEFORE BREAKFAST 90 tablet 2  . Multiple Vitamin (MULTIVITAMIN) tablet Take 1 tablet by mouth daily.      . Naproxen Sodium (ALEVE PO) Take by mouth daily.    . NON FORMULARY CPAP      No current facility-administered medications for this visit.   Family History  Problem Relation Age of Onset  . Prostate cancer Father   . Coronary artery disease Father   . Bladder Cancer Father   . Prostate cancer Brother   . Coronary artery disease Maternal Grandfather   . Coronary artery disease Paternal Grandfather        in old age  . Diabetes Cousin   . Diabetes Son   . Colon cancer Paternal Uncle 72   There is a family history of dementia. His mother had dementia and 5 of 8 children on that side of the family have also developed the condition. His maternal grandmother also had dementia, although it was in her late 19s. His mother became symptomatic in her late 26s. She thinks the other family members became symptomatic around their 33s. The patient has two full siblings, both of whom are cognitively intact. His brother is 48 years older than him and his sister just turned 34. There is no  family history of psychiatric illness.  Psychosocial History  Developmental, Educational and Employment History: The [place] up [place] farm, in [place]. He described his childhood as normal. He stated that he did adequately in school, and denied ever being held back or having any specific learning difficulties. He worked for many years at the [employer] farm and now he works on the [employer] range. He is involved in more maintenance and "special projects" as opposed to handling firearms, although he does carry a weapon. 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   Psychiatric History: The patient has no history of significant psychiatric problems or treatment.  Substance Use History: The patient doesn't drink, he smokes cigars occasionally but not cigarettes, he doesn't use any illicit substances.   Relationship History and Living Cimcumstances: The patient and his wife have been together for 74 years and married for 21 years. He has a son from his first marriage, who is in his 105s. He hasn't been around enough to really see the changes, as per the patients wife.   Mental Status and Behavioral Observations  Sensorium/Arousal: The patient's level of arousal was awake and alert. Hearing and vision were adequate for testing purposes. Orientation: The patient was fully oriented to person, place, time, and situation.  Appearance: Dressed in appropriate casual clothing, malodorous, with a strong smell of cat on his clothing. Otherwise his grooming and hygiene were adequate.  Behavior: Minimally interactive, did demonstrate humor at times appropriately Speech/language: Soft spoken and somewhat hypophonic, which is not a change as per his wife. No semantic paraphasia or word finding problems in spontaneous speech.  Gait/Posture: Appeared mildly wide based but stable, possible diminished armswing (L > R).  Movement: No overt signs/symptoms of movement disorder during interview and testing with me but technician commented he was tremulous on some graphomotor tests.  Social Comportment: Appropriate, minimally interactive unless interacted with Mood: "Fine" Affect: Euthymic Thought process/content: Thought process was fairly logical and linear although he deferred to his wife to answer most questions and had  a hard time giving his own history due to anosognosia. No delusional thought content noted.  Safety: No thoughts of harming self or other safety concerns noted. Specifically, his wife stated he has no physical aggressiveness.  Insight: Fair  MMSE - Mini Mental State Exam 03/05/2020  Orientation to time 5  Orientation to Place 5  Registration 3  Attention/ Calculation 2  Recall 0  Language- name 2 objects 2  Language- repeat 1  Language- follow 3 step command 3  Language- read & follow direction 1  Write a sentence 1  Copy design 0  Total score 23    Upper Limb Praxis: Left: 4/4 Right: 3/4  More difficulty with praxis in dominant hand, making BPFT errors on hammer use and with some minor spatial errors. No florid apraxia noted.  Hand writing was within normal limits.   He had a hard time getting into set with Luria motor sequencing and once again, and better performance in the left than right hand, which is odd given his handedness. Was unable to even get into set on the right.   Test Procedures  Wide Range Achievement Test - 4             Word Reading Neuropsychological Assessment Battery  List Learning  Story Learning  Daily Living Memory  Naming  Digit Span Repeatable Battery for the Assessment of Neuropsychological Status (Form A)  Figure Copy  Judgment of Line Orientation  Coding  Figure Recall The Dot Counting Test A Random Letter Test Controlled Oral Word Association (F-A-S) Semantic Fluency (Animals) Trail Making Test A & B Complex Ideational Material Modified [place] Card Sorting Test Geriatric Depression Scale - Short Form2 Quick Dementia Rating System (completed by wife, Rodena Piety)  Plan  GERVIS GABA was seen for a psychiatric diagnostic evaluation and neuropsychological testing. He is a pleasant, 64 year old, right-hand dominant man with a history of increased anger/irritability over the past 7 or 8 years and memory and thinking problems more  recently. On detailed interview of symptoms typically encountered in FTD, all he really has is excessive anger, apathy,  and cognitive impairment (including prominent memory impairment, which is less prototypical). He does have a possible history of dream enactment although that has been going on for 20 years or so and thus may be of less diagnostic relevance. He is screening in the mild dementia range today. Full and complete note with impressions, recommendations, and interpretation of test data to follow.   Viviano Simas Nicole Kindred, PsyD, [place] Clinical Neuropsychologist  Informed Consent and Coding/Compliance  Risks and benefits of the evaluation were discussed with the patient prior to all testing procedures. I conducted a clinical interview and neuropsychological testing (at least two tests) with Wandra Scot and Lamar Benes, B.S. (Technician) administered additional test procedures. The patient was able to tolerate the testing procedures and the patient (and/or family if applicable) is likely to benefit from further follow up to receive the diagnosis and treatment recommendations, which will be rendered at the next encounter. Billing below reflects technician time, my direct face-to-face time with the patient, time spent in test administration, and time spent in professional activities including but not limited to: neuropsychological test interpretation, integration of neuropsychological test data with clinical history, report preparation, treatment planning, care coordination, and review of diagnostically pertinent medical history or studies.   Services associated with this encounter: Clinical Interview [phone]) plus 60 minutes (60109; Neuropsychological Evaluation by Professional)  60 minutes (32355; Neuropsychological Evaluation by Professional, Adl.) 22 minutes (73220; Test Administration by Professional) 30 minutes (25427; Neuropsychological Testing by Technician) 80 minutes (06237;  Neuropsychological Testing by Technician, Adl.)

## 2020-03-06 NOTE — Progress Notes (Signed)
[facility]  Patient Name: Miguel Mendez MRN: [id number] Date of Birth: June 16, 1955 Age: 64 y.o. Education: 12 years  Measurement properties of test scores: IQ, Index, and Standard Scores (SS): Mean = 100; Standard Deviation = 15 Scaled Scores (Ss): Mean = 10; Standard Deviation = 3 Z scores (Z): Mean = 0; Standard Deviation = 1 T scores (T); Mean = 50; Standard Deviation = 10  TEST SCORES:    Note: This summary of test scores accompanies the interpretive report and should not be interpreted by unqualified individuals or in isolation without reference to the report. Test scores are relative to age, gender, and educational history as available and appropriate.   Performance Validity        "A" Random Letter Test Raw  Descriptor      Errors 0 Within Expectation  The Dot Counting Test: 21 Within Expectation      Mental Status Screening     Total Score Descriptor  MMSE 23 Mild Dementia      Expected Functioning        Wide Range Achievement Test: Standard/Scaled Score Percentile      Word Reading 79 8      Attention/Processing Speed        Neuropsychological Assessment Battery (Attention Module, Form 1): T-score Percentile      Digits Forward 48 42      Digits Backwards 31 3      Repeatable Battery for the Assessment of Neuropsychological Status (Form A): Scaled Score Percentile      Coding 3 1      Language        Neuropsychological Assessment Battery (Language Module, Form 1): T-score Percentile      Naming   (29) 44 27      Verbal Fluency: T-score Percentile      Controlled Oral Word Association (F-A-S) 36 8      Semantic Fluency (Animals) 27 1      Memory:        Neuropsychological Assessment Battery (Memory Module, Form 1): T-score Percentile      List Learning           List A Immediate Recall   (1, 4, 4) 21 <1         List B Immediate Recall   (4) 52 58         List A Short Delayed Recall   (0) 22 <1         List A  Long Delayed Recall   (0) 29 2         List A Percent Retention   (0 %) --- <1         List A Long Delayed Yes/No Recognition Hits   (11) --- 50         List A Long Delayed Yes/No Recognition False Alarms   (13) --- <1         List A Recognition Discriminability Index --- <1     Story Learning           Immediate Recall   (16, 25) 38 12         Delayed Recall   (13) 35 7         Percent Retention   (52 %) --- <1      Daily Living Memory            Immediate Recall   (16, 12) 35 7  Delayed Recall   (5, 0) 24 <1          Percent Retention (45 %) --- <1          Recognition Hits    (4) --- <1      Repeatable Battery for the Assessment of Neuropsychological Status (Form A): Scaled Score Percentile         Figure Recall   (0) 1 <1      Visuospatial/Constructional Functioning        Repeatable Battery for the Assessment of Neuropsychological Status (Form A): Standard/Scaled Score Percentile     Visuospatial/Constructional Index 66 1         Figure Copy   (16) 6 9         Judgment of Line Orientation   (8) --- <2      Executive Functioning        Modified [place] Card Sorting Test (MWCST): Standard/T-Score Percentile      Number of Categories Correct 19 <1      Number of Perseverative Errors 23 <1      Number of Total Errors 24 <1      Percent Perseverative Errors 31 3  Executive Function Composite 54 <1      Trail Making Test: T-Score Percentile      Part A 38 12      Part B 15 <1      Boston Diagnostic Aphasia Exam: Raw Score Scaled Score      Complex Ideational Material 7 2      Clock Drawing Raw Score Descriptor      Command 4 Moderate Impairment      Rating Scales        Clinical Dementia Rating Raw Score Descriptor      Sum of Boxes 5.5 Mild Dementia      Global Score 1.0 Mild Dementia      Quick Dementia Rating System Raw Score Descriptor      Sum of Boxes 4 Very Mild Dementia      Total Score 8.5 Mild Dementia  Geriatric Depression Scale - Short Form 1  Negative   Tyke Outman V. Nicole Kindred PsyD, [place] Clinical Neuropsychologist

## 2020-03-09 NOTE — Progress Notes (Signed)
[facility]  Patient Name: JASDEEP KEPNER MRN: [id number] Date of Birth: 1956/02/19 Age: 64 y.o. Education: 12 years  Clinical Impressions  MARIN MILLEY is a 64 y.o., right-hand dominant, married man with a history of OSA, hypothyroidism, HLD, and memory loss/personality changes for several years. His behavior changes started as far back as 7- 8 years ago and over the past several years, with irritability and anger, and he has developed significant apathy over the past 3-4 years. He started having difficulties with home projects and following multi-step instructions as far back as 3 years ago, he developed memory problems some time later, and his wife reported that his memory problems have "drastically worsened" over the past 4 or 5 months, to the point that he does not remember many things. 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 He has an MRI of the brain that shows a likely pathological burden of atrophy, albeit in a nonspecific distribution, with perhaps a bit more on the right than the left. There is also a fair amount of atrophy in the left temporal lobe. There is minimal leukoaraiosis. Importantly, he has a strong family history of dementia with 5 out of 8 children on his mother's side developing the condition. He has two full siblings who remain cognitively intact. He also has been having possible dream enactment behavior for some time, rolling around in bed and at times hitting the side of the bed.   On neuropsychological testing, Mr. Fare demonstrated relatively global cognitive impairment. My sense is that executive difficulties likely undermined his performance in other areas but that there may be additional primary memory, visuospatial, and attentional impairment. His memory profile does show that he is retaining some information  across time, particularly with respect to structured verbal information, which suggests an executive contribution. He had preserved visual object confrontation naming, which was his best area of performance. Qualitatively, he seemed to have the most difficulties on tasks involve executive function and attention and he did better on visuospatial constructional tasks although his scores were still psychometrically low. He screened negative for the presence of depression and his wife characterized him as functioning at a mild dementia level. His CDR is concordant and places him in the mild dementia range.   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 His cognitive presentation is relatively undifferentiated, but may go less well with a typical presentation of AD because he does not have naming problems, is retaining some information, and has relatively better preserved visuospatial function qualitatively. It is dubious as to whether he meets clinical criteria for bvFTD, because his main behavioral changes (apathy, irritability) are nonspecific and not florid, yet his strong family history and an unusual constellation of symptoms makes me lean in the direction of an FTD condition. I am specifically concerned about FTD caused by [id number], which can be associated with RBD albeit infrequently. RBD can also be associated with MAPT mutations but his atrophy and lack of Parkinsonism do not fit. GRN mutation is a possibility. There may be an outside possibility of AD but that is lower on the differential and his family has mostly later onset dementia, which would be strange for PSEN. There are no signs and symptoms to suggest a synucleinopathy and his cognitive profile and clinical presentation do not fit. Would  recommend that Mr. Ciotti possible RBD be confirmed polysomnographically. I would also recommend that he consider genetic testing for causes of FTD and AD  although I think his presentation fits less well with the latter class of condition. I am concerned about him carrying a firearm given the extent of his cognitive difficulties and will discuss that with him and his wife.   Diagnostic Impressions: Mild dementia, unspecified dementia subtype  Frontal lobe and executive function deficit   Recommendations to be discussed with patient  Your presentation and performance on assessment were suggestive of fairly global cognitive impairment. That is to say, you had difficulties in most domains assessed. Looking at the data qualitatively, I think that the biggest issue is in what is called "executive function," which are those abilities that allow one to apply other lower order cognitive skills to meaningful behavior. I believe that you have reached a mild dementia level of function, considering your cognitive test data and your clinical history.   Dementia refers to a group of syndromes where multiple areas of ability are damaged in the brain, such as memory, thinking, judgment, and behavior, and most commonly refers to age related causes of dementia that cause worsening in these abilities over time. Alzheimer's disease is the most common form of dementia in people over the age of 85. Not all dementias are Alzheimer's disease, but all Alzheimer's disease is dementia. When dementia is due to an underlying condition affecting the brain, such as Alzheimer's disease, there is progression over time, which typically proceeds gradually over many years.   In your case, you have an uncommon presentation. 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 Nevertheless, given your strong family history of dementia (5/8 family members) and the uncommon constellation of symptoms you present, I think it most likely that one of the FTD  conditions is the cause of your problem. Further workup can help [place] have a higher level of confidence.  bvFTD can be caused by a number of different pathological processes.   First, I think it is important that you get a sleep study to confirm the presence of REM sleep behavior disorder.   Second, I think you would benefit from genetic testing, because I suspect there may be a genetic cause of your problem. You should be tested for genetic causes of FTD and also Alzheimer's Disease just in case. I am most worried about C9ORF72 mutations, which is the most common gene associated with bvFTD, but this mutation is still a very rare and uncommon condition. This would confirm your diagnosis and would also provide valuable information for your family, because the gene is autosomal dominant, meaning that most people who have the gene will go on to develop an FTD condition and you have children.   I am concerned, given your occupation, that you may be at risk for undesirable outcomes. My recommendation would be that you retire, because your level of cognitive impairment is sufficient that it may be difficult for you to function at your job. I am also worried about you carrying a firearm, because bvFTD frequently impairs decision making and impulse control.   Your neuropsychological test findings and diagnosis of mild dementia are strongly supportive of disability status.   At this time, I do not have specific concerns about your ability to operate a motor vehicle, although I would suggest that you monitor the situation carefully and  on the road testing may also be helpful. You have fairly substantial impairment including on measures of processing speed, visuospatial, and executive functioning, viewed as important for driving abilities.   You are already on a good front-line medication for dementia.   Instead of medications, I recommend behavioral strategies for dealing with the behavioral and psychological  issues that can accompany dementia. Things like agitation, wandering, and anxiety can often be improved or eliminated using the "three R's." Redirection (help distract your loved one by focusing their attention on something else, moving them to a new environment, or otherwise engaging them in something other than what is distressing to them), Reassurance (reassure them that you are there to take care of them and that there is nothing they need to be worried about), and Reconsidering (consider the situation from their perspective and try to identify if there is something about the situation or environment that may be triggering their reaction).  The following compensatory strategies may be helpful for managing day-to-day memory symptoms: . Minimize distractions and interruptions to the extent possible. Be an active observer, present-minded, and focus attention. Focus on only one task for a period of time.  . Get organized. Establish routines and stick to them. Make and use checklists.  . Use external memory aids as needed, such as a planner and notebook. Repetition, written reminders, and keeping a calendar of appointments may be helpful. Marlene Lard a place to keep your keys, wallet, cell phone, and other personal belongings.  . Break down tasks into smaller steps to help get started and to keep from feeling overwhelmed.  . Increase your success learning information by breaking it into manageable chunks, connecting it to previously learned information, or forming associations with what you are trying to remember.   Test Findings  Test scores are summarized in additional documentation associated with this encounter. Test scores are relative to age, gender, and educational history as available and appropriate. There were no concerns about performance validity as all findings fell within normal expectations.   General Intellectual Functioning/Achievement:  Performance on single word reading fell at an  unusually low level. This tempers expectations for Mr. Lalla premorbid functioning to some extent and therefore his cognitive test data.   Attention and Processing Efficiency: Performance on indicators of attention suggested intact simple auditory attention with average digit repetition forward yet unusually low digit repetition backward. He also had difficulty with serial subtraction of 7s from 100 with 0/5 correct on the MMSE.   Timed indicators of processing speed generated weak scores across the board, with extremely low timed number-symbol coding and low average (nearly unusually low) simple numeric sequencing. The latter measure is a very easy task.   Language: Performance on visual object confrontation naming was within normal limits. Generation of words in response to phonemic and semantic prompts, on the other hand, was low with unusually to extremely low scores, respectively.   Visuospatial Function: Performance was low on visuospatial measures with an extremely low score on the overall index. I would note that much of this may be on the basis of executive and attentional issues. Copy of a line drawing was extremely low but qualitatively was fairly good in terms of gestalt. Judgment of angular line orientations was extremely low.   Learning and Memory: Performance on measures of memory and learning was likely undermined to some extent by executive problems, although I do suspect at least some memory impairment as well. He was able to retain some structured verbal information  over time and had greater difficulties unstructured verbal information, a profile often implicating executive abilities.    In the verbal realm, he was able to learn 1, 4, and 4 words of a 12-item word list across three learning trials followed by no words at short or long delayed recall, which is extremely low for all scores. His recognition discriminability for words from the list versus false choices was extremely  low also. With daily living information, he demonstrated unusually low initial learning and extremely low delayed recall but he did retain some information. Recognition in this case was low. He did better with structured verbal information with low average immediate recall and unusually low delayed recall, although he did remember a fair amount of information. His percent retention was extremely low, psychometrically speaking.   Memory for visual information in the form of a modestly complex figure was extremely low (he did not remember any figure details).   Executive Functions: Performance within this domain was significantly and notably impaired with extremely low scores on nearly all measures. The Executive Function Composite from a card-sorting measure emphasizing concept formation and cognitive flexibility was extremely low, alternating sequencing of numbers and letters of the alphabet was extremely low, and his reasoning with verbal information on the Complex Ideational Material was also extremely low. Performance was consistent with "moderate impairment" on clock drawing, with no numbers and 3 hands. There was perseveration on drawing of consecutive loops and ramparts.   Rating Scale(s): Mr. Beske was characterized as functioning at a very mild to mild dementia level by his wife. I was able to rate a CDR for him and his global and sum of boxes scores place in him the mild dementia range, which presents as the best severity classification considering all information. He screened negative for the presence of depression.   Viviano Simas Nicole Kindred PsyD, [place] Clinical Neuropsychologist

## 2020-03-11 LAB — MAYO MISC ORDER: PRICE:: 1646.8

## 2020-03-18 ENCOUNTER — Ambulatory Visit (INDEPENDENT_AMBULATORY_CARE_PROVIDER_SITE_OTHER): Payer: 59 | Admitting: Counselor

## 2020-03-18 ENCOUNTER — Encounter: Payer: Self-pay | Admitting: Counselor

## 2020-03-18 ENCOUNTER — Other Ambulatory Visit: Payer: Self-pay

## 2020-03-18 DIAGNOSIS — F0390 Unspecified dementia without behavioral disturbance: Secondary | ICD-10-CM | POA: Diagnosis not present

## 2020-03-18 DIAGNOSIS — F03A0 Unspecified dementia, mild, without behavioral disturbance, psychotic disturbance, mood disturbance, and anxiety: Secondary | ICD-10-CM

## 2020-03-18 NOTE — Patient Instructions (Signed)
Your presentation and performance on assessment were suggestive of fairly global cognitive impairment. That is to say, you had difficulties in most domains assessed. Looking at the data qualitatively, I think that the biggest issue is in what is called "executive function," which are those abilities that allow one to apply other lower order cognitive skills to meaningful behavior. I believe that you have reached a mild dementia level of function, considering your cognitive test data and your clinical history.   Dementia refers to a group of syndromes where multiple areas of ability are damaged in the brain, such as memory, thinking, judgment, and behavior, and most commonly refers to age related causes of dementia that cause worsening in these abilities over time. Alzheimer's disease is the most common form of dementia in people over the age of 23. Not all dementias are Alzheimer's disease, but all Alzheimer's disease is dementia. When dementia is due to an underlying condition affecting the brain, such as Alzheimer's disease, there is progression over time, which typically proceeds gradually over many years.   In your case, you have an uncommon presentation. 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 Nevertheless, given your strong family history of dementia (5/8 family members) and the uncommon constellation of symptoms you present, I think it most likely that one of the FTD conditions is the cause of your problem. Further workup can help [place] have a higher level of confidence.  bvFTD can be caused by a number of different pathological processes.   First, I think it is important that you get a sleep study to confirm the presence of REM sleep behavior disorder.   Second, I think you would benefit from genetic testing, because I suspect there may be a genetic  cause of your problem. You should be tested for genetic causes of FTD and also Alzheimer's Disease just in case. I am most worried about C9ORF72 mutations, which is the most common gene associated with bvFTD, but this mutation is still a very rare and uncommon condition. This would confirm your diagnosis and would also provide valuable information for your family, because the gene is autosomal dominant, meaning that most people who have the gene will go on to develop an FTD condition and you have children.   I am concerned, given your occupation, that you may be at risk for undesirable outcomes. My recommendation would be that you retire, because your level of cognitive impairment is sufficient that it may be difficult for you to function at your job. As we discussed, job functioning cannot be predicted with great accuracy based on neuropsychological tests, but most people with dementia are unable to work. I am also worried about you carrying a firearm, because bvFTD frequently impairs decision making and impulse control.   We discussed that if you do wish to continue working, it may make the most sense for you to undergo a fitness for duty evaluation. This would be an evaluation specifically seeing whether you are competent and safe to perform your job duties, which could more specifically address this question.  Your neuropsychological test findings and diagnosis of mild dementia are strongly supportive of disability status.   At this time, I do not have specific concerns about your ability to operate a motor vehicle, although I would suggest that you monitor the situation carefully and on the road testing may also be helpful. You have fairly substantial impairment  including on measures of processing speed, visuospatial, and executive functioning, viewed as important for driving abilities.   You are already on a good front-line medication for dementia.   Instead of medications, I recommend  behavioral strategies for dealing with the behavioral and psychological issues that can accompany dementia. Things like agitation, wandering, and anxiety can often be improved or eliminated using the "three R's." Redirection (help distract your loved one by focusing their attention on something else, moving them to a new environment, or otherwise engaging them in something other than what is distressing to them), Reassurance (reassure them that you are there to take care of them and that there is nothing they need to be worried about), and Reconsidering (consider the situation from their perspective and try to identify if there is something about the situation or environment that may be triggering their reaction).  The following compensatory strategies may be helpful for managing day-to-day memory symptoms:  Minimize distractions and interruptions to the extent possible. Be an active observer, present-minded, and focus attention. Focus on only one task for a period of time.   Get organized. Establish routines and stick to them. Make and use checklists.   Use external memory aids as needed, such as a planner and notebook. Repetition, written reminders, and keeping a calendar of appointments may be helpful.  Designate a place to keep your keys, wallet, cell phone, and other personal belongings.   Break down tasks into smaller steps to help get started and to keep from feeling overwhelmed.   Increase your success learning information by breaking it into manageable chunks, connecting it to previously learned information, or forming associations with what you are trying to remember.

## 2020-03-18 NOTE — Progress Notes (Signed)
NEUROPSYCHOLOGY FEEDBACK NOTE [facility]  Feedback Note: I met with Miguel Mendez to review the findings resulting from Miguel Mendez's neuropsychological evaluation. Since the last appointment, he has been about the same. Time was spent reviewing the impressions and recommendations that are detailed in the evaluation report. I had a long discussion with the patient and his wife about the complexities of the differential in his case. He does not present with a typical phenotype of bvFTD although does weakly meet criteria and I am most concerned about a possible genetic FTD condition. Admittedly, there is a broad differential and AD either autosomal or otherwise would be within the differential. Less likely is a synucleinopathy, PSP, or CBD, although the later two conditions can of course cause bvFTD phenotypes. I had a long discussion with them about my concerns that he is still working, particularly given that he carries a weapon on the job. The patient himself presents as quite sedate, reasonable, and this concern is more related to his diagnosis and the fact that he likely has a brain disease known to compromise decision making than anything specific to him. The patient seems to have no interest in retiring and does not believe anything is wrong. I recommended that he undergo fitness for duty evaluation if he wishes to continue working. We also discussed anosognosia and the importance of supporting one another in this challenging transition time. I took time to explain the findings and answer all the patient's questions. I encouraged him to contact me should Miguel Mendez have any further questions or if further follow up is desired.   Current Medications and Medical History   Current Outpatient Medications  Medication Sig Dispense Refill  . donepezil (ARICEPT) 10 MG tablet Take 1/2 tablet daily for 2 weeks, then increase to 1 tablet daily 30 tablet 11  . Glucosamine-Chondroitin  (OSTEO BI-FLEX REGULAR STRENGTH PO) Take by mouth. 2 a day    . levothyroxine (SYNTHROID) 88 MCG tablet TAKE ONE TABLET BY MOUTH DAILY BEFORE BREAKFAST 90 tablet 2  . Multiple Vitamin (MULTIVITAMIN) tablet Take 1 tablet by mouth daily.      . Naproxen Sodium (ALEVE PO) Take by mouth daily.    . NON FORMULARY CPAP      No current facility-administered medications for this visit.    Patient Active Problem List   Diagnosis Date Noted  . Memory change 12/19/2019  . Colon cancer screening 10/04/2016  . Elevated glucose level 10/04/2016  . Obesity (BMI 30.0-34.9) 10/04/2016  . Hypothyroidism 11/21/2013  . Family history of prostate cancer 10/30/2012  . OBSTRUCTIVE SLEEP APNEA 11/08/2007  . Low testosterone 10/30/2007  . Hyperlipidemia 10/30/2007    Mental Status and Behavioral Observations  Miguel Mendez presented on time to the present encounter and was alert and generally oriented. Speech was normal in rate, rhythm, volume, and prosody. Self-reported mood was "fine" and affect was neutral. Thought process was logical and goal oriented and thought content was appropriate to the topics discussed. There were no safety concerns identified at today's encounter, such as thoughts of harming self or others.   Plan  Feedback provided regarding the patient's neuropsychological evaluation. I was candid with him that I think he should retire and that having dementia places him at increased risk of impaired decision making. 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 He will think about genetic testing, his  wife seemed interested. They have follow up with Dr. Delice Lesch. EIAN VANDERVELDEN was encouraged to contact me if any questions arise or if further follow up is desired.   Viviano Simas Nicole Kindred, PsyD, [employer] Clinical Neuropsychologist  Service(s) Provided at This Encounter: 43 minutes [phone]; Conjoint  therapy with patient present)

## 2020-03-31 ENCOUNTER — Other Ambulatory Visit: Payer: Self-pay | Admitting: Family Medicine

## 2020-04-10 ENCOUNTER — Ambulatory Visit: Payer: 59 | Admitting: Neurology

## 2020-05-13 ENCOUNTER — Other Ambulatory Visit: Payer: Self-pay

## 2020-05-13 ENCOUNTER — Encounter: Payer: Self-pay | Admitting: Neurology

## 2020-05-13 ENCOUNTER — Ambulatory Visit: Payer: 59 | Admitting: Neurology

## 2020-05-13 VITALS — BP 118/78 | HR 82 | Ht 73.0 in | Wt 247.0 lb

## 2020-05-13 DIAGNOSIS — F03A0 Unspecified dementia, mild, without behavioral disturbance, psychotic disturbance, mood disturbance, and anxiety: Secondary | ICD-10-CM

## 2020-05-13 DIAGNOSIS — F0390 Unspecified dementia without behavioral disturbance: Secondary | ICD-10-CM | POA: Diagnosis not present

## 2020-05-13 DIAGNOSIS — G4752 REM sleep behavior disorder: Secondary | ICD-10-CM

## 2020-05-13 MED ORDER — ESCITALOPRAM OXALATE 10 MG PO TABS: 10.0000 mg | ORAL_TABLET | Freq: Every day | ORAL | 11 refills | Status: DC

## 2020-05-13 MED ORDER — DONEPEZIL HCL 10 MG PO TABS
ORAL_TABLET | ORAL | 11 refills | Status: DC
Start: 2020-05-13 — End: 2021-06-14

## 2020-05-13 NOTE — Patient Instructions (Signed)
1. Start Lexapro 10mg  daily  2. Continue Donepezil 10mg  daily  3. Schedule sleep study  4. We will look into the genetic testing  5. Follow-up in 6 months, call for any changes   FALL PRECAUTIONS: Be cautious when walking. Scan the area for obstacles that may increase the risk of trips and falls. When getting up in the mornings, sit up at the edge of the bed for a few minutes before getting out of bed. Consider elevating the bed at the head end to avoid drop of blood pressure when getting up. Walk always in a well-lit room (use night lights in the walls). Avoid area rugs or power cords from appliances in the middle of the walkways. Use a walker or a cane if necessary and consider physical therapy for balance exercise. Get your eyesight checked regularly.  FINANCIAL OVERSIGHT: Supervision, especially oversight when making financial decisions or transactions is also recommended.  HOME SAFETY: Consider the safety of the kitchen when operating appliances like stoves, microwave oven, and blender. Consider having supervision and share cooking responsibilities until no longer able to participate in those. Accidents with firearms and other hazards in the house should be identified and addressed as well.  DRIVING: Regarding driving, in patients with progressive memory problems, driving will be impaired. We advise to have someone else do the driving if trouble finding directions or if minor accidents are reported. Independent driving assessment is available to determine safety of driving.  ABILITY TO BE LEFT ALONE: If patient is unable to contact 911 operator, consider using LifeLine, or when the need is there, arrange for someone to stay with patients. Smoking is a fire hazard, consider supervision or cessation. Risk of wandering should be assessed by caregiver and if detected at any point, supervision and safe proof recommendations should be instituted.  MEDICATION SUPERVISION: Inability to  self-administer medication needs to be constantly addressed. Implement a mechanism to ensure safe administration of the medications.  RECOMMENDATIONS FOR ALL PATIENTS WITH MEMORY PROBLEMS: 1. Continue to exercise (Recommend 30 minutes of walking everyday, or 3 hours every week) 2. Increase social interactions - continue going to [place] and enjoy social gatherings with friends and family 3. Eat healthy, avoid fried foods and eat more fruits and vegetables 4. Maintain adequate blood pressure, blood sugar, and blood cholesterol level. Reducing the risk of stroke and cardiovascular disease also helps promoting better memory. 5. Avoid stressful situations. Live a simple life and avoid aggravations. Organize your time and prepare for the next day in anticipation. 6. Sleep well, avoid any interruptions of sleep and avoid any distractions in the bedroom that may interfere with adequate sleep quality 7. Avoid sugar, avoid sweets as there is a strong link between excessive sugar intake, diabetes, and cognitive impairment We discussed the Mediterranean diet, which has been shown to help patients reduce the risk of progressive memory disorders and reduces cardiovascular risk. This includes eating fish, eat fruits and green leafy vegetables, nuts like almonds and hazelnuts, walnuts, and also use olive oil. Avoid fast foods and fried foods as much as possible. Avoid sweets and sugar as sugar use has been linked to worsening of memory function.  There is always a concern of gradual progression of memory problems. If this is the case, then we may need to adjust level of care according to patient needs. Support, both to the patient and caregiver, should then be put into place.
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
## 2020-06-24 ENCOUNTER — Other Ambulatory Visit: Payer: Self-pay

## 2020-06-24 ENCOUNTER — Ambulatory Visit (HOSPITAL_BASED_OUTPATIENT_CLINIC_OR_DEPARTMENT_OTHER): Payer: 59 | Admitting: Internal Medicine

## 2020-06-24 DIAGNOSIS — G4733 Obstructive sleep apnea (adult) (pediatric): Secondary | ICD-10-CM

## 2020-06-25 ENCOUNTER — Telehealth: Payer: Self-pay | Admitting: Neurology

## 2020-06-25 NOTE — Telephone Encounter (Signed)
Spoke with sleep center order that was place yesterday is what was needed

## 2020-07-20 ENCOUNTER — Other Ambulatory Visit: Payer: Self-pay | Admitting: Neurology

## 2020-07-20 MED ORDER — ESCITALOPRAM OXALATE 20 MG PO TABS: 20.0000 mg | ORAL_TABLET | Freq: Every day | ORAL | 11 refills | Status: DC

## 2020-10-01 ENCOUNTER — Other Ambulatory Visit: Payer: Self-pay | Admitting: Family Medicine

## 2020-11-25 ENCOUNTER — Other Ambulatory Visit: Payer: Self-pay

## 2020-11-25 ENCOUNTER — Encounter: Payer: Self-pay | Admitting: Neurology

## 2020-11-25 ENCOUNTER — Ambulatory Visit (INDEPENDENT_AMBULATORY_CARE_PROVIDER_SITE_OTHER): Payer: Self-pay | Admitting: Neurology

## 2020-11-25 VITALS — BP 124/84 | HR 81 | Ht 72.0 in | Wt 244.0 lb

## 2020-11-25 DIAGNOSIS — F0281 Dementia in other diseases classified elsewhere with behavioral disturbance: Secondary | ICD-10-CM

## 2020-11-25 DIAGNOSIS — G4733 Obstructive sleep apnea (adult) (pediatric): Secondary | ICD-10-CM

## 2020-11-25 DIAGNOSIS — F02818 Dementia in other diseases classified elsewhere, unspecified severity, with other behavioral disturbance: Secondary | ICD-10-CM

## 2020-11-25 DIAGNOSIS — G3109 Other frontotemporal dementia: Secondary | ICD-10-CM

## 2020-11-25 MED ORDER — MEMANTINE HCL 10 MG PO TABS: ORAL_TABLET | ORAL | 11 refills | Status: DC

## 2020-11-25 MED ORDER — ESCITALOPRAM OXALATE 20 MG PO TABS
ORAL_TABLET | ORAL | 11 refills | Status: DC
Start: 2020-11-25 — End: 2021-06-28
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
## 2020-11-25 NOTE — Patient Instructions (Signed)
Increase Lexapro '20mg'$ : Take 1 and 1/2 tablets daily  2. Start Memantine '10mg'$ : take 1 tablet every night for 2 weeks, then increase to 1 tablet twice a day  3. Recommend joining the gym or [employer] for regular exercise, look into day programs  4. No further driving  5. Follow-up in 6 months, call for any changes   FALL PRECAUTIONS: Be cautious when walking. Scan the area for obstacles that may increase the risk of trips and falls. When getting up in the mornings, sit up at the edge of the bed for a few minutes before getting out of bed. Consider elevating the bed at the head end to avoid drop of blood pressure when getting up. Walk always in a well-lit room (use night lights in the walls). Avoid area rugs or power cords from appliances in the middle of the walkways. Use a walker or a cane if necessary and consider physical therapy for balance exercise. Get your eyesight checked regularly.   HOME SAFETY: Consider the safety of the kitchen when operating appliances like stoves, microwave oven, and blender. Consider having supervision and share cooking responsibilities until no longer able to participate in those. Accidents with firearms and other hazards in the house should be identified and addressed as well.   ABILITY TO BE LEFT ALONE: If patient is unable to contact 911 operator, consider using LifeLine, or when the need is there, arrange for someone to stay with patients. Smoking is a fire hazard, consider supervision or cessation. Risk of wandering should be assessed by caregiver and if detected at any point, supervision and safe proof recommendations should be instituted.   RECOMMENDATIONS FOR ALL PATIENTS WITH MEMORY PROBLEMS: 1. Continue to exercise (Recommend 30 minutes of walking everyday, or 3 hours every week) 2. Increase social interactions - continue going to [place] and enjoy social gatherings with friends and family 3. Eat healthy, avoid fried foods and eat more fruits and  vegetables 4. Maintain adequate blood pressure, blood sugar, and blood cholesterol level. Reducing the risk of stroke and cardiovascular disease also helps promoting better memory. 5. Avoid stressful situations. Live a simple life and avoid aggravations. Organize your time and prepare for the next day in anticipation. 6. Sleep well, avoid any interruptions of sleep and avoid any distractions in the bedroom that may interfere with adequate sleep quality 7. Avoid sugar, avoid sweets as there is a strong link between excessive sugar intake, diabetes, and cognitive impairment The Mediterranean diet has been shown to help patients reduce the risk of progressive memory disorders and reduces cardiovascular risk. This includes eating fish, eat fruits and green leafy vegetables, nuts like almonds and hazelnuts, walnuts, and also use olive oil. Avoid fast foods and fried foods as much as possible. Avoid sweets and sugar as sugar use has been linked to worsening of memory function.  There is always a concern of gradual progression of memory problems. If this is the case, then we may need to adjust level of care according to patient needs. Support, both to the patient and caregiver, should then be put into place.

## 2020-12-28 ENCOUNTER — Other Ambulatory Visit: Payer: Self-pay | Admitting: Family Medicine

## 2020-12-31 NOTE — Telephone Encounter (Signed)
Pt is due for a f/u, med refilled once and routed to pool to schedule f/u or CPE. Pt will need labs to check thyroid so if he can't come to [place] please schedule appt in next year for in office f/u or CPE

## 2021-01-04 ENCOUNTER — Telehealth: Payer: Self-pay | Admitting: Family Medicine

## 2021-01-04 NOTE — Telephone Encounter (Signed)
Left v/m for pt. To call to schedule a cpe/lab

## 2021-01-06 ENCOUNTER — Encounter: Payer: Self-pay | Admitting: Family Medicine

## 2021-01-06 MED ORDER — LEVOTHYROXINE SODIUM 88 MCG PO TABS
88.0000 ug | ORAL_TABLET | Freq: Every day | ORAL | 1 refills | Status: DC
Start: 2021-01-06 — End: 2021-10-07

## 2021-03-03 ENCOUNTER — Other Ambulatory Visit: Payer: Self-pay | Admitting: Neurology

## 2021-03-03 MED ORDER — DIVALPROEX SODIUM ER 250 MG PO TB24: ORAL_TABLET | ORAL | 5 refills | Status: DC

## 2021-05-16 IMAGING — MR MR HEAD WO/W CM
12 series · 48 of 48 positions shown · IV contrast (multihance)
Comparison: None.

CLINICAL DATA: Memory loss

EXAM:
MRI HEAD WITHOUT AND WITH CONTRAST
TECHNIQUE: Multiplanar, multiecho pulse sequences of the brain and surrounding
structures were obtained without and with intravenous contrast.
CONTRAST:  20mL MULTIHANCE GADOBENATE DIMEGLUMINE 529 MG/ML IV SOLN

[Series 5: T1 · sagittal · 4.0mm · 0.75mm/px · 1 of 31 slices shown (1 of 3)]
[im 1/31]
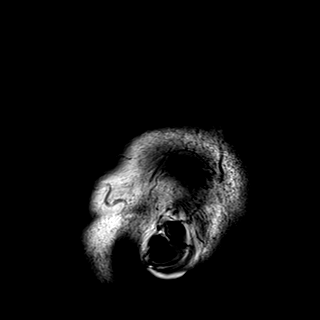

[Series 6: DWI · axial · 3.0mm · 1.44mm/px · z∈[-53,+99]mm · 6 of 96 slices shown (1 of 4)]
[im 1/96]
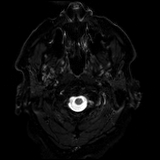
[im 20/96]
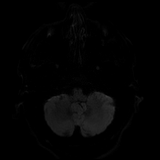
[im 39/96]
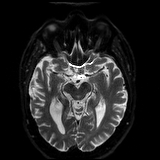
[im 58/96]
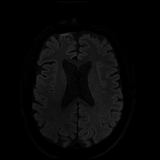
[im 77/96]
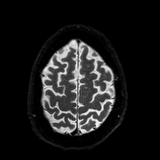
[im 96/96]
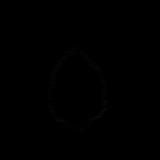

[Series 7: DWI · axial · 3.0mm · 1.44mm/px · z∈[-53,+99]mm · 3 of 48 slices shown (2 of 4)]
[im 1/48]
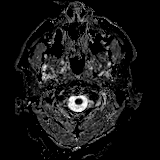
[im 24/48]
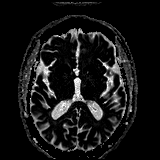
[im 48/48]
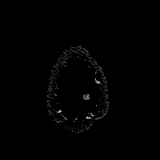

[Series 8: DWI · coronal · 5.0mm · 1.44mm/px · 4 of 68 slices shown (3 of 4)]
[im 1/68]
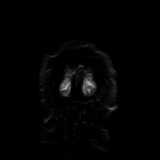
[im 23/68]
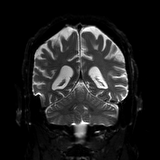
[im 45/68]
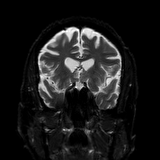
[im 68/68]
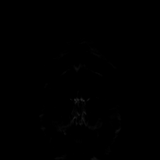

[Series 9: DWI · coronal · 5.0mm · 1.44mm/px · 2 of 34 slices shown (4 of 4)]
[im 1/34]
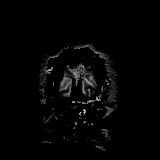
[im 34/34]
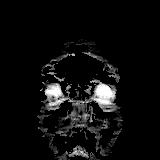

[Series 10: T2 · axial · 4.0mm · 0.36mm/px · z∈[-54,+100]mm · 2 of 31 slices shown]
[im 1/31]
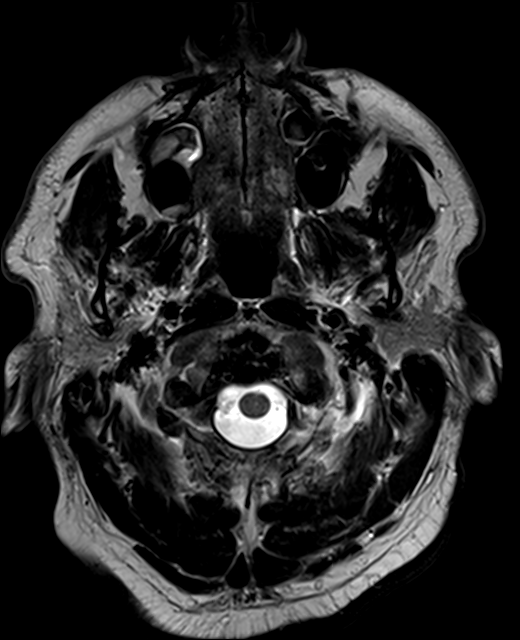
[im 31/31]
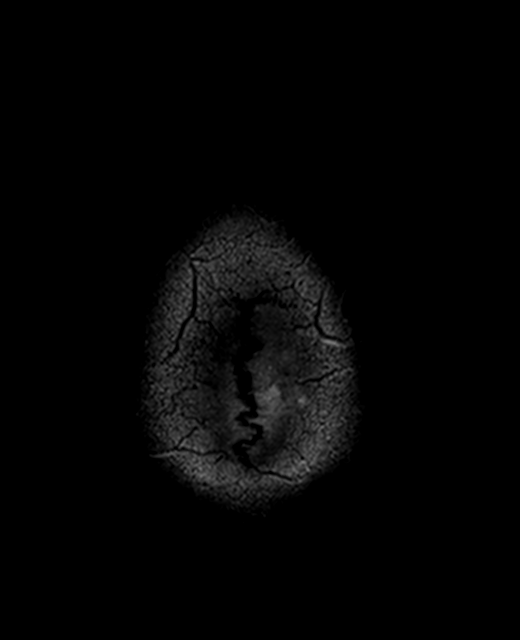

[Series 11: FLAIR · axial · 3.0mm · 0.72mm/px · z∈[-51,+97]mm · 2 of 26 slices shown]
[im 1/26]
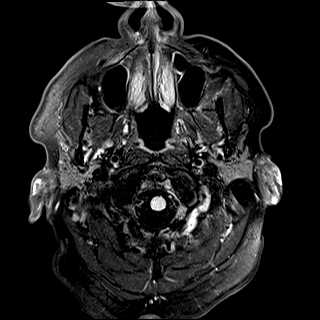
[im 26/26]
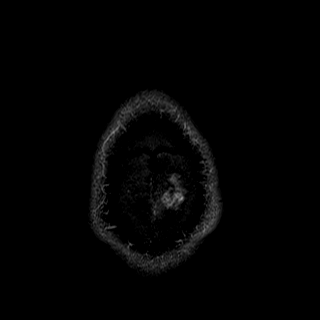

[Series 13: swi_images · axial · 1.5mm · 0.90mm/px · z∈[-53,+99]mm · 6 of 104 slices shown]
[im 1/104]
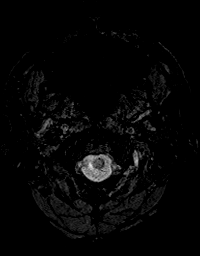
[im 21/104]
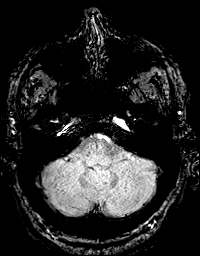
[im 42/104]
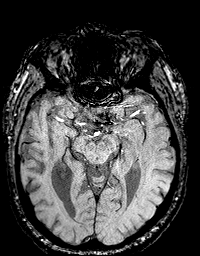
[im 62/104]
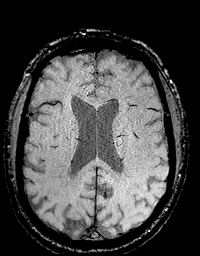
[im 83/104]
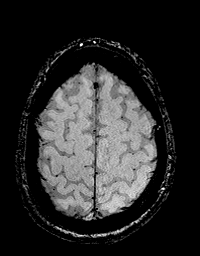
[im 104/104]
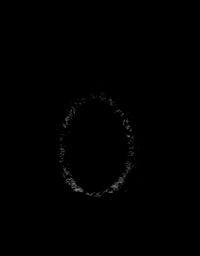

[Series 14: T1 · axial · 1.0mm · 0.94mm/px · z∈[-54,+102]mm · 9 of 160 slices shown (2 of 3)]
[im 1/160]
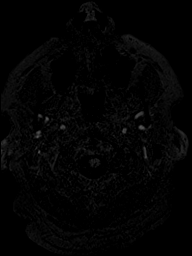
[im 20/160]
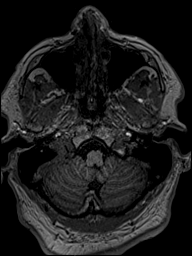
[im 40/160]
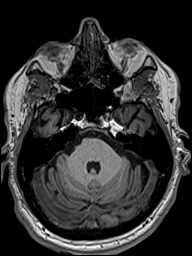
[im 60/160]
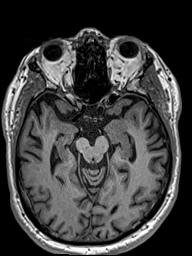
[im 80/160]
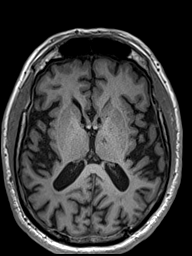
[im 100/160]
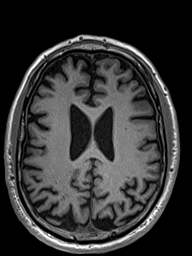
[im 120/160]
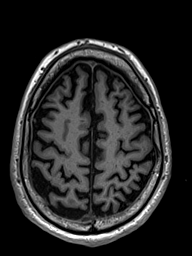
[im 140/160]
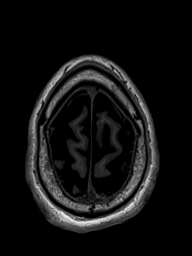
[im 160/160]
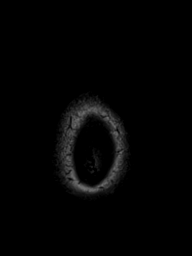

[Series 15: T2 post-contrast · coronal · 4.5mm · 0.36mm/px · 2 of 35 slices shown]
[im 1/35]
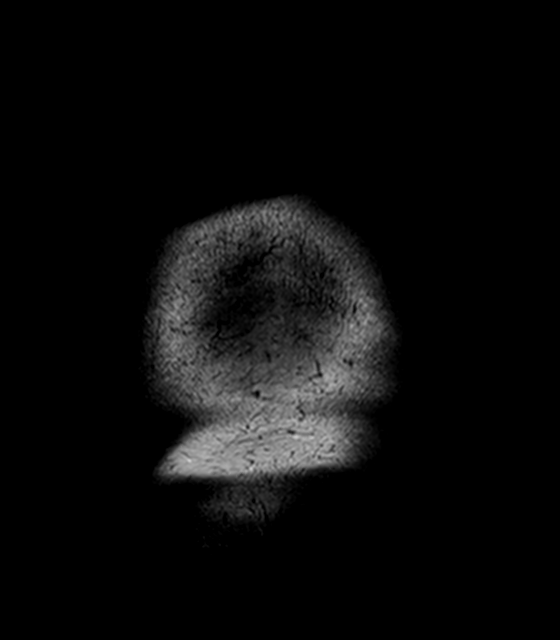
[im 35/35]
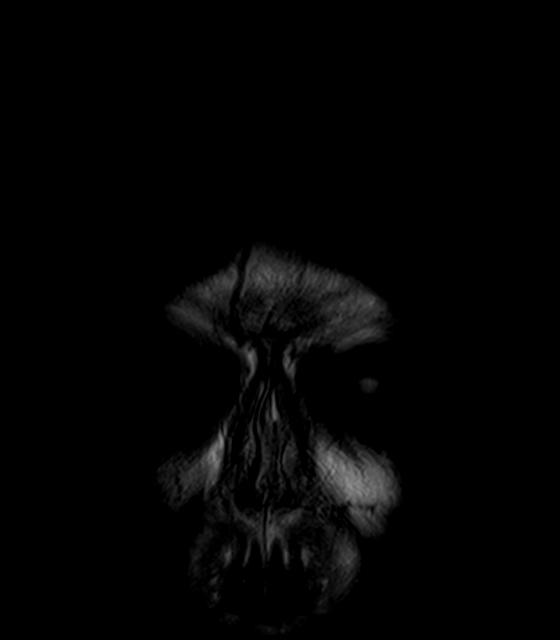

[Series 16: T1 · axial · 1.0mm · 0.94mm/px · z∈[-54,+102]mm · 9 of 160 slices shown (3 of 3)]
[im 1/160]
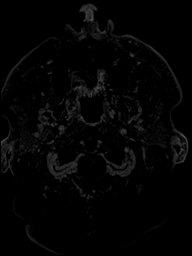
[im 20/160]
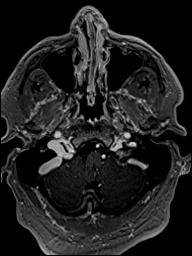
[im 40/160]
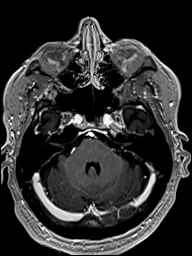
[im 60/160]
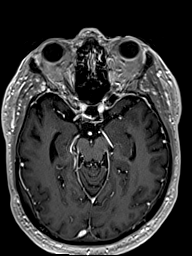
[im 80/160]
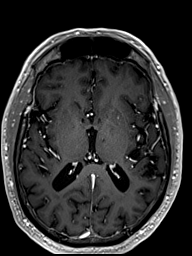
[im 100/160]
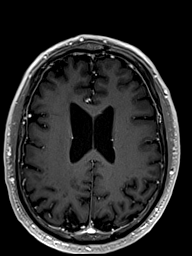
[im 120/160]
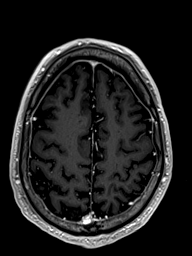
[im 140/160]
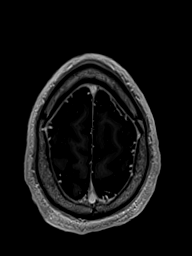
[im 160/160]
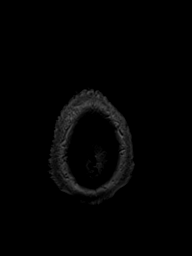

[Series 17: T1 post-contrast · coronal · 4.5mm · 0.72mm/px · 2 of 35 slices shown]
[im 1/35]
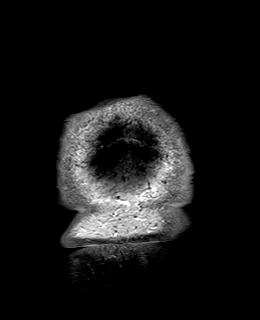
[im 35/35]
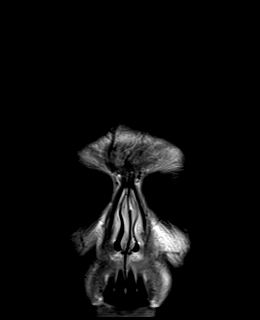

[48 of 48 positions shown; findings below may reference images not displayed]

FINDINGS: Brain: Global atrophy without lobar predominance. Negative for
hydrocephalus.

Negative for acute or chronic infarction. Normal white matter.
Negative for hemorrhage or mass. Normal enhancement.

Vascular: Normal arterial flow voids.  Normal venous enhancement.

Skull and upper cervical spine: No focal skeletal lesion.

Sinuses/Orbits: Mild mucosal edema paranasal sinuses. Negative orbit

Other: None
IMPRESSION: Global atrophy without lobar predominance. No significant chronic
ischemic change. No acute abnormality.

## 2021-05-17 ENCOUNTER — Ambulatory Visit (INDEPENDENT_AMBULATORY_CARE_PROVIDER_SITE_OTHER): Payer: 59 | Admitting: Nurse Practitioner

## 2021-05-17 ENCOUNTER — Other Ambulatory Visit: Payer: Self-pay

## 2021-05-17 VITALS — BP 108/76 | HR 91 | Temp 98.2°F | Resp 10 | Ht 73.0 in | Wt 238.2 lb

## 2021-05-17 DIAGNOSIS — L0291 Cutaneous abscess, unspecified: Secondary | ICD-10-CM | POA: Insufficient documentation

## 2021-05-17 MED ORDER — CEFTRIAXONE SODIUM 1 G IJ SOLR
1.0000 g | Freq: Once | INTRAMUSCULAR | Status: AC
  Administered 2021-05-17: 1 g via INTRAMUSCULAR

## 2021-05-17 MED ORDER — DOXYCYCLINE HYCLATE 100 MG PO TABS: 100.0000 mg | ORAL_TABLET | Freq: Two times a day (BID) | ORAL | 0 refills | Status: DC

## 2021-05-17 NOTE — Patient Instructions (Addendum)
Nice to see you today We gave you a dose of antibiotic in the muscle in office. You can start the pills tomorrow. I want to see you back in 1 week to make sure we are getting better. You can schedule down in [place] if it is easier. If not schedule with me.

## 2021-05-17 NOTE — Assessment & Plan Note (Signed)
Patient here for abscess to posterior lateral right neck.  Was indurated so unable to I&D in office.  Therefore continue wound culture.  Did give Rocephin 1 g IM in office for 1 dose.  Start patient on doxycycline 100 mg twice daily for 7 days.  Discussed signs and symptoms when to seek urgent or emergent health care.  Patient is to follow-up 1 week with me to ensure resolution and healing of spot.  Stable for outpatient follow-up as there is no airway or esophageal compromise no lymphadenopathy.

## 2021-05-17 NOTE — Progress Notes (Signed)
Acute Office Visit  Subjective:    Patient ID: Miguel Mendez, male    DOB: 1955-12-06, 66 y.o.   MRN: [id number]  Chief Complaint  Patient presents with   Cyst on the back of his neck    On the right side of the back of his neck, pain is present. Some drainage-white/creamy looking pus and sometimes clear with blood tinge to it. Started out as a small bump about a week ago    HPI Patient is in today for bump on neck  States noticed it last week States Thursday and Friday started increasing in size and started oozing purulent discharge  States have tried warm compresss and warm shower and neosporin. With out great relief   Hurting per patient report. States that he noticed it started hurting on Saturday.  Past Medical History:  Diagnosis Date   Acute upper respiratory infections of unspecified site    Family history of malignant neoplasm of prostate    Hyperlipidemia    Other malaise and fatigue    Other testicular hypofunction    Sleep apnea    CPAP use   Thyroid disease    Tobacco use disorder     Past Surgical History:  Procedure Laterality Date   FINGER FRACTURE SURGERY Left    middle   KNEE SURGERY Right    SHOULDER SURGERY Bilateral     Family History  Problem Relation Age of Onset   Prostate cancer Father    Coronary artery disease Father    Bladder Cancer Father    Prostate cancer Brother    Coronary artery disease Maternal Grandfather    Coronary artery disease Paternal Grandfather        in old age   Diabetes Cousin    Diabetes Son    Colon cancer Paternal Uncle 84    Social History   Socioeconomic History   Marital status: Married    Spouse name: Not on file   Number of children: Not on file   Years of education: Not on file   Highest education level: Not on file  Occupational History   Occupation: Architect  Tobacco Use   Smoking status: Former    Types: Cigars   Smokeless tobacco: Never   Tobacco comments:    quit 03/2013   Vaping Use   Vaping Use: Never used  Substance and Sexual Activity   Alcohol use: Not Currently    Alcohol/week: 0.0 standard drinks    Comment: Rare   Drug use: No   Sexual activity: Not on file  Other Topics Concern   Not on file  Social History Narrative   Works at [employer].        Married; 1 child      Right handed       Lives in a one story home       Drinks coffee everyday and coke zeros    Social Determinants of Health   Financial Resource Strain: Not on file  Food Insecurity: Not on file  Transportation Needs: Not on file  Physical Activity: Not on file  Stress: Not on file  Social Connections: Not on file  Intimate Partner Violence: Not on file    Outpatient Medications Prior to Visit  Medication Sig Dispense Refill   diphenhydramine-acetaminophen (TYLENOL PM) 25-500 MG TABS tablet Take 1 tablet by mouth at bedtime as needed.     divalproex (DEPAKOTE ER) 250 MG 24 hr tablet Take 1 capsule every  evening for 1 week, then increase to 1 capsule twice a day 60 tablet 5   donepezil (ARICEPT) 10 MG tablet Take 1 tablet daily 30 tablet 11   escitalopram (LEXAPRO) 20 MG tablet Take 1 and 1/2 tablets daily 45 tablet 11   Glucosamine-Chondroitin (OSTEO BI-FLEX REGULAR STRENGTH PO) Take by mouth. 2 a day     levothyroxine (SYNTHROID) 88 MCG tablet Take 1 tablet (88 mcg total) by mouth daily with breakfast. 90 tablet 1   Melatonin 10 MG TABS Take by mouth.     memantine (NAMENDA) 10 MG tablet Take 1 tablet every night for 2 weeks, then increase to 1 tablet twice a day 60 tablet 11   Multiple Vitamin (MULTIVITAMIN) tablet Take 1 tablet by mouth daily.     Naproxen Sodium (ALEVE PO) Take by mouth daily.     NON FORMULARY CPAP     No facility-administered medications prior to visit.    Allergies  Allergen Reactions   Bee Venom Swelling    Review of Systems  Constitutional:  Negative for chills and fever.  HENT:  Negative for sore throat,  trouble swallowing and voice change.   Respiratory:  Negative for cough and shortness of breath.   Cardiovascular:  Negative for chest pain.  Gastrointestinal:  Negative for diarrhea, nausea and vomiting.  Skin:  Positive for color change and wound.  Neurological:  Positive for headaches. Negative for dizziness, light-headedness and numbness.      Objective:    Physical Exam Nursing note reviewed.  Constitutional:      Appearance: Normal appearance.  HENT:     Mouth/Throat:     Mouth: Mucous membranes are moist.     Pharynx: Oropharynx is clear.  Cardiovascular:     Rate and Rhythm: Normal rate and regular rhythm.  Pulmonary:     Effort: Pulmonary effort is normal.     Breath sounds: Normal breath sounds.  Skin:    General: Skin is warm.     Findings: Erythema and lesion present.     Comments: 2.5 in x 2 in abscess was indurated and not fluctuant   4 in across of induration   See clinical picture  Neurological:     Mental Status: He is alert.     BP 108/76    Pulse 91    Temp 98.2 F (36.8 C)    Resp 10    Ht 6\' 1"  (1.854 m)    Wt 238 lb 4 oz (108.1 kg)    SpO2 100%    BMI 31.43 kg/m  Wt Readings from Last 3 Encounters:  05/17/21 238 lb 4 oz (108.1 kg)  11/25/20 244 lb (110.7 kg)  05/13/20 247 lb (112 kg)    Health Maintenance Due  Topic Date Due   COVID-19 Vaccine (1) Never done   Zoster Vaccines- Shingrix (1 of 2) Never done   COLONOSCOPY (Pts 45-12yrs Insurance coverage will need to be confirmed)  01/17/2020   INFLUENZA VACCINE  11/23/2020    There are no preventive care reminders to display for this patient.   Lab Results  Component Value Date   TSH 2.00 12/19/2019   Lab Results  Component Value Date   WBC 7.0 12/19/2019   HGB 14.8 12/19/2019   HCT 43.2 12/19/2019   MCV 96.2 12/19/2019   PLT 194.0 12/19/2019   Lab Results  Component Value Date   NA 140 12/19/2019   K 4.2 12/19/2019   CO2 25 12/19/2019   GLUCOSE  108 (H) 12/19/2019   BUN 10  12/19/2019   CREATININE 1.19 12/19/2019   BILITOT 0.7 12/19/2019   ALKPHOS 60 12/19/2019   AST 20 12/19/2019   ALT 23 12/19/2019   PROT 6.6 12/19/2019   ALBUMIN 4.2 12/19/2019   CALCIUM 9.4 12/19/2019   GFR 61.74 12/19/2019   Lab Results  Component Value Date   CHOL 155 12/19/2019   Lab Results  Component Value Date   HDL 40.90 12/19/2019   Lab Results  Component Value Date   LDLCALC 83 12/19/2019   Lab Results  Component Value Date   TRIG 158.0 (H) 12/19/2019   Lab Results  Component Value Date   CHOLHDL 4 12/19/2019   Lab Results  Component Value Date   HGBA1C 6.0 06/05/2018       Assessment & Plan:   Problem List Items Addressed This Visit       Other   Abscess - Primary    Patient here for abscess to posterior lateral right neck.  Was indurated so unable to I&D in office.  Therefore continue wound culture.  Did give Rocephin 1 g IM in office for 1 dose.  Start patient on doxycycline 100 mg twice daily for 7 days.  Discussed signs and symptoms when to seek urgent or emergent health care.  Patient is to follow-up 1 week with me to ensure resolution and healing of spot.  Stable for outpatient follow-up as there is no airway or esophageal compromise no lymphadenopathy.      Relevant Medications   doxycycline (VIBRA-TABS) 100 MG tablet     Meds ordered this encounter  Medications   cefTRIAXone (ROCEPHIN) injection 1 g   doxycycline (VIBRA-TABS) 100 MG tablet    Sig: Take 1 tablet (100 mg total) by mouth 2 (two) times daily.    Dispense:  7 tablet    Refill:  0    Order Specific Question:   Supervising Provider    Answer:   Loura Pardon A [[id number]]   This visit occurred during the SARS-CoV-2 public health emergency.  Safety protocols were in place, including screening questions prior to the visit, additional usage of staff PPE, and extensive cleaning of exam room while observing appropriate contact time as indicated for disinfecting solutions.    Romilda Garret, NP

## 2021-05-18 ENCOUNTER — Encounter: Payer: Self-pay | Admitting: Nurse Practitioner

## 2021-05-18 DIAGNOSIS — L0291 Cutaneous abscess, unspecified: Secondary | ICD-10-CM

## 2021-05-18 MED ORDER — DOXYCYCLINE HYCLATE 100 MG PO TABS: 100.0000 mg | ORAL_TABLET | Freq: Two times a day (BID) | ORAL | 0 refills | Status: DC

## 2021-05-24 ENCOUNTER — Encounter: Payer: Self-pay | Admitting: Nurse Practitioner

## 2021-05-24 ENCOUNTER — Ambulatory Visit: Payer: 59 | Admitting: Nurse Practitioner

## 2021-05-24 DIAGNOSIS — L0291 Cutaneous abscess, unspecified: Secondary | ICD-10-CM

## 2021-05-25 ENCOUNTER — Telehealth: Payer: Self-pay

## 2021-05-25 MED ORDER — DOXYCYCLINE HYCLATE 100 MG PO TABS: 100.0000 mg | ORAL_TABLET | Freq: Two times a day (BID) | ORAL | 0 refills | Status: AC

## 2021-05-25 NOTE — Telephone Encounter (Signed)
Per chart patient set up follow up appointment with [facility] tomorrow 05/26/21

## 2021-05-25 NOTE — Telephone Encounter (Signed)
[place] PA submitted through covermymeds.Pending decision Key: [id number] - PA Case ID: [id number]

## 2021-05-25 NOTE — Telephone Encounter (Signed)
Can we make sure they get scheduled up for a follow up please

## 2021-05-25 NOTE — Telephone Encounter (Signed)
Called and left a message to set patient up for follow up

## 2021-05-26 ENCOUNTER — Encounter: Payer: Self-pay | Admitting: Family

## 2021-05-26 ENCOUNTER — Other Ambulatory Visit: Payer: Self-pay

## 2021-05-26 ENCOUNTER — Ambulatory Visit (INDEPENDENT_AMBULATORY_CARE_PROVIDER_SITE_OTHER): Payer: 59 | Admitting: Family

## 2021-05-26 VITALS — BP 116/82 | HR 75 | Temp 97.5°F | Ht 73.0 in | Wt 239.0 lb

## 2021-05-26 DIAGNOSIS — L0211 Cutaneous abscess of neck: Secondary | ICD-10-CM | POA: Diagnosis not present

## 2021-05-26 MED ORDER — GENTAMICIN SULFATE 0.1 % EX OINT: 1.0000 "application " | TOPICAL_OINTMENT | Freq: Three times a day (TID) | CUTANEOUS | 0 refills | Status: AC

## 2021-05-26 NOTE — Assessment & Plan Note (Signed)
Add gentamicin ointment to daily regimen apply to wound 2 times daily and cover with nonadherent dressing and paper tape daily as well.  Continue doxycycline for 5 more days as already prescribed.  Wound culture obtained today pending results.  Urgent referral to wound care has been placed patient to let me know if in the next 2 to 3 days he do not hear anything so we can expect this further.  Patient and wife informed if any worsening signs of infection to include redness fever chills increasing drainage please let me know immediately.

## 2021-05-26 NOTE — Patient Instructions (Addendum)
Gentamicin ointment has sent to the pharmacy for you to start applying once 1-2 times daily to the site.  Keep wound covered as well with nonadherent dressing and paper tape especially during nighttime when you are trying to sleep to not aggravate it further.  I placed a stat wound care referral for evaluation and treatment please let me know if in the next 3 days or so you do not hear from them so we can get this under control.  Please monitor for worsening signs or symptoms of infection to include increasing redness increasing drainage and/or fever chills.  It was a pleasure seeing you today! Please do not hesitate to reach out with any questions and or concerns.  Regards,   Eugenia Pancoast FNP-C

## 2021-05-26 NOTE — Progress Notes (Signed)
Established Patient Office Visit  Subjective:  Patient ID: Miguel Mendez, male    DOB: Jan 07, 1956  Age: 66 y.o. MRN: [id number]  CC:  Chief Complaint  Patient presents with   Wound Infection    HPI Miguel Mendez is here today for follow up.   Right lower posterior neck abscess. Wife accompanying him. White/creamy clear drainage per wife over the last few days. States looking better day by day. No fever and or chills. Does itch a bit per pt.  Saw [name] [place], 05/17/21 for this and was given rocephin 1 gm IM in office for one dose as well as prescribed outpatient doxycycline 100 mg po bid for 12 total days. Pt currently on day eight.  Past Medical History:  Diagnosis Date   Acute upper respiratory infections of unspecified site    Family history of malignant neoplasm of prostate    Hyperlipidemia    Other malaise and fatigue    Other testicular hypofunction    Sleep apnea    CPAP use   Thyroid disease    Tobacco use disorder     Past Surgical History:  Procedure Laterality Date   FINGER FRACTURE SURGERY Left    middle   KNEE SURGERY Right    SHOULDER SURGERY Bilateral     Family History  Problem Relation Age of Onset   Prostate cancer Father    Coronary artery disease Father    Bladder Cancer Father    Prostate cancer Brother    Coronary artery disease Maternal Grandfather    Coronary artery disease Paternal Grandfather        in old age   Diabetes Cousin    Diabetes Son    Colon cancer Paternal Uncle 41    Social History   Socioeconomic History   Marital status: Married    Spouse name: Not on file   Number of children: Not on file   Years of education: Not on file   Highest education level: Not on file  Occupational History   Occupation: Architect  Tobacco Use   Smoking status: Former    Types: Cigars   Smokeless tobacco: Never   Tobacco comments:    quit 03/2013  Vaping Use   Vaping Use: Never used  Substance and Sexual Activity    Alcohol use: Not Currently    Alcohol/week: 0.0 standard drinks    Comment: Rare   Drug use: No   Sexual activity: Not on file  Other Topics Concern   Not on file  Social History Narrative   Works at [employer].        Married; 1 child      Right handed       Lives in a one story home       Drinks coffee everyday and coke zeros    Social Determinants of Health   Financial Resource Strain: Not on file  Food Insecurity: Not on file  Transportation Needs: Not on file  Physical Activity: Not on file  Stress: Not on file  Social Connections: Not on file  Intimate Partner Violence: Not on file    Outpatient Medications Prior to Visit  Medication Sig Dispense Refill   diphenhydramine-acetaminophen (TYLENOL PM) 25-500 MG TABS tablet Take 1 tablet by mouth at bedtime as needed.     divalproex (DEPAKOTE ER) 250 MG 24 hr tablet Take 1 capsule every evening for 1 week, then increase to 1 capsule twice a day 60 tablet  5   donepezil (ARICEPT) 10 MG tablet Take 1 tablet daily 30 tablet 11   doxycycline (VIBRA-TABS) 100 MG tablet Take 1 tablet (100 mg total) by mouth 2 (two) times daily for 5 days. 10 tablet 0   escitalopram (LEXAPRO) 20 MG tablet Take 1 and 1/2 tablets daily 45 tablet 11   Glucosamine-Chondroitin (OSTEO BI-FLEX REGULAR STRENGTH PO) Take by mouth. 2 a day     levothyroxine (SYNTHROID) 88 MCG tablet Take 1 tablet (88 mcg total) by mouth daily with breakfast. 90 tablet 1   Melatonin 10 MG TABS Take by mouth.     memantine (NAMENDA) 10 MG tablet Take 1 tablet every night for 2 weeks, then increase to 1 tablet twice a day 60 tablet 11   Multiple Vitamin (MULTIVITAMIN) tablet Take 1 tablet by mouth daily.     Naproxen Sodium (ALEVE PO) Take by mouth daily.     NON FORMULARY CPAP     No facility-administered medications prior to visit.    Allergies  Allergen Reactions   Bee Venom Swelling    ROS Review of Systems  Constitutional:  Negative for  chills, fatigue and fever.  Skin:  Positive for wound (right posterior neck wound draining yellow discharge, improving per pt and wife. decreasing in size slightly).     Objective:    Physical Exam Constitutional:      General: He is not in acute distress.    Appearance: Normal appearance. He is obese. He is not ill-appearing, toxic-appearing or diaphoretic.  Pulmonary:     Effort: Pulmonary effort is normal.  Skin:    Findings: Wound present. Nodular rash: right posterior neck abscess/wound with surrounding erythema with central area of yellow pus with scabbing, slight erythema surorunding wound edges not approximated.  Neurological:     Mental Status: He is alert.    BP 116/82    Pulse 75    Temp (!) 97.5 F (36.4 C) (Temporal)    Ht 6\' 1"  (1.854 m)    Wt 239 lb (108.4 kg)    SpO2 98%    BMI 31.53 kg/m  Wt Readings from Last 3 Encounters:  05/26/21 239 lb (108.4 kg)  05/17/21 238 lb 4 oz (108.1 kg)  11/25/20 244 lb (110.7 kg)     Health Maintenance Due  Topic Date Due   COLONOSCOPY (Pts 45-21yrs Insurance coverage will need to be confirmed)  01/17/2020   INFLUENZA VACCINE  11/23/2020   COVID-19 Vaccine (4 - Booster for Pfizer series) 04/27/2021    There are no preventive care reminders to display for this patient.  Lab Results  Component Value Date   TSH 2.00 12/19/2019   Lab Results  Component Value Date   WBC 7.0 12/19/2019   HGB 14.8 12/19/2019   HCT 43.2 12/19/2019   MCV 96.2 12/19/2019   PLT 194.0 12/19/2019   Lab Results  Component Value Date   NA 140 12/19/2019   K 4.2 12/19/2019   CO2 25 12/19/2019   GLUCOSE 108 (H) 12/19/2019   BUN 10 12/19/2019   CREATININE 1.19 12/19/2019   BILITOT 0.7 12/19/2019   ALKPHOS 60 12/19/2019   AST 20 12/19/2019   ALT 23 12/19/2019   PROT 6.6 12/19/2019   ALBUMIN 4.2 12/19/2019   CALCIUM 9.4 12/19/2019   GFR 61.74 12/19/2019   Lab Results  Component Value Date   HGBA1C 6.0 06/05/2018      Assessment &  Plan:   Problem List Items Addressed This Visit  Other   Abscess of neck - Primary    Add gentamicin ointment to daily regimen apply to wound 2 times daily and cover with nonadherent dressing and paper tape daily as well.  Continue doxycycline for 5 more days as already prescribed.  Wound culture obtained today pending results.  Urgent referral to wound care has been placed patient to let me know if in the next 2 to 3 days he do not hear anything so we can expect this further.  Patient and wife informed if any worsening signs of infection to include redness fever chills increasing drainage please let me know immediately.      Relevant Medications   gentamicin ointment (GARAMYCIN) 0.1 %   Other Relevant Orders   AMB referral to wound care center   Anaerobic and Aerobic Culture    Meds ordered this encounter  Medications   gentamicin ointment (GARAMYCIN) 0.1 %    Sig: Apply 1 application topically 3 (three) times daily for 10 days.    Dispense:  30 g    Refill:  0    Order Specific Question:   Supervising Provider    Answer:   Diona Browner, AMY E [[id number]]    Follow-up: Return in about 1 week (around 06/02/2021).    Eugenia Pancoast, FNP

## 2021-05-27 NOTE — Progress Notes (Signed)
Wound culture was un-revealing , however, continue with antbx and wound care referral.

## 2021-05-31 NOTE — Progress Notes (Signed)
Ok this is great.

## 2021-05-31 NOTE — Progress Notes (Signed)
Sensitivity and culture did now come back with positive staph via wound cultu ewhich is sensitive for doxycycline which is antbx he has been taking so this is good. No changes necessary. Has he heard from wound care?

## 2021-06-01 LAB — ANAEROBIC AND AEROBIC CULTURE
MICRO NUMBER:: 12949184
MICRO NUMBER:: 12949185
SPECIMEN QUALITY:: ADEQUATE
SPECIMEN QUALITY:: ADEQUATE

## 2021-06-04 ENCOUNTER — Ambulatory Visit: Payer: 59 | Admitting: Physician Assistant

## 2021-06-04 ENCOUNTER — Other Ambulatory Visit: Payer: Self-pay

## 2021-06-04 ENCOUNTER — Ambulatory Visit (INDEPENDENT_AMBULATORY_CARE_PROVIDER_SITE_OTHER): Payer: 59 | Admitting: Family Medicine

## 2021-06-04 ENCOUNTER — Encounter: Payer: Self-pay | Admitting: Family Medicine

## 2021-06-04 VITALS — BP 126/76 | HR 69 | Temp 98.2°F | Ht 73.0 in | Wt 233.1 lb

## 2021-06-04 DIAGNOSIS — R7309 Other abnormal glucose: Secondary | ICD-10-CM | POA: Diagnosis not present

## 2021-06-04 DIAGNOSIS — L0211 Cutaneous abscess of neck: Secondary | ICD-10-CM

## 2021-06-04 LAB — POCT GLYCOSYLATED HEMOGLOBIN (HGB A1C): Hemoglobin A1C: 5.8 % — AB (ref 4.0–5.6)

## 2021-06-04 NOTE — Patient Instructions (Signed)
Your wound is looking better  Keep clean with soap and water  Do not submerge   Cover lightly when needed to keep dirt out   Watch for increased redness or swelling or drainage Alert [place] if the area of redness increased or shows streaks  Keep  using the gentamicin ointment   Follow up in 2 weeks for a re check     A1c check today

## 2021-06-04 NOTE — Progress Notes (Signed)
Subjective:    Patient ID: Miguel Mendez, male    DOB: [birth year], 66 y.o.   MRN: [id number]  This visit occurred during the SARS-CoV-2 public health emergency.  Safety protocols were in place, including screening questions prior to the visit, additional usage of staff PPE, and extensive cleaning of exam room while observing appropriate contact time as indicated for disinfecting solutions.   HPI Pt presents for f/u of neck abscess  Wt Readings from Last 3 Encounters:  06/04/21 233 lb 2 oz (105.7 kg)  05/26/21 239 lb (108.4 kg)  05/17/21 238 lb 4 oz (108.1 kg)   30.76 kg/m   He was seen by NP Dugal on 05/26/21 (and NP Cable prior to that)  Tx with rocephin 1 g and then doxycycline 100 mg gid for 12 days  Then added gentamicin ointment bid and dressing   Wound cx was ordered -this grew staph aureus sensitive to doxy (was resistant to sulfa and quinolone)   Also stat wound clinic referral done   Now doing much better !  Not draining  No longer very painful  No fever   No problems with doxy Tolerated well   Feels ok   Had appt today for wound clinic  Then doctor was not in network - held off on another ref for now since doing better   R lower/posterior neck abscess with drainage  Lab Results  Component Value Date   HGBA1C 6.0 06/05/2018  Eats fairly healthy  Wife checks his sugar and it has been fine   Lab Results  Component Value Date   HGBA1C 5.8 (A) 06/04/2021   Improved today  Lab Results  Component Value Date   CREATININE 1.19 12/19/2019   BUN 10 12/19/2019   NA 140 12/19/2019   K 4.2 12/19/2019   CL 108 12/19/2019   CO2 25 12/19/2019     Patient Active Problem List   Diagnosis Date Noted   Abscess of neck 05/26/2021   Memory change 12/19/2019   Colon cancer screening 10/04/2016   Elevated glucose level 10/04/2016   Obesity (BMI 30.0-34.9) 10/04/2016   Hypothyroidism 11/21/2013   Family history of prostate cancer 10/30/2012   OBSTRUCTIVE  SLEEP APNEA 11/08/2007   Low testosterone 10/30/2007   Hyperlipidemia 10/30/2007   Past Medical History:  Diagnosis Date   Acute upper respiratory infections of unspecified site    Family history of malignant neoplasm of prostate    Hyperlipidemia    Other malaise and fatigue    Other testicular hypofunction    Sleep apnea    CPAP use   Thyroid disease    Tobacco use disorder    Past Surgical History:  Procedure Laterality Date   FINGER FRACTURE SURGERY Left    middle   KNEE SURGERY Right    SHOULDER SURGERY Bilateral    Social History   Tobacco Use   Smoking status: Former    Types: Cigars   Smokeless tobacco: Never   Tobacco comments:    quit 03/2013  Vaping Use   Vaping Use: Never used  Substance Use Topics   Alcohol use: Not Currently    Alcohol/week: 0.0 standard drinks    Comment: Rare   Drug use: No   Family History  Problem Relation Age of Onset   Prostate cancer Father    Coronary artery disease Father    Bladder Cancer Father    Prostate cancer Brother    Coronary artery disease Maternal Grandfather    Coronary  artery disease Paternal Grandfather        in old age   Diabetes Cousin    Diabetes Son    Colon cancer Paternal Uncle 65   Allergies  Allergen Reactions   Bee Venom Swelling   Current Outpatient Medications on File Prior to Visit  Medication Sig Dispense Refill   diphenhydramine-acetaminophen (TYLENOL PM) 25-500 MG TABS tablet Take 1 tablet by mouth at bedtime as needed.     divalproex (DEPAKOTE ER) 250 MG 24 hr tablet Take 1 capsule every evening for 1 week, then increase to 1 capsule twice a day 60 tablet 5   donepezil (ARICEPT) 10 MG tablet Take 1 tablet daily 30 tablet 11   escitalopram (LEXAPRO) 20 MG tablet Take 1 and 1/2 tablets daily 45 tablet 11   Glucosamine-Chondroitin (OSTEO BI-FLEX REGULAR STRENGTH PO) Take by mouth. 2 a day     levothyroxine (SYNTHROID) 88 MCG tablet Take 1 tablet (88 mcg total) by mouth daily with  breakfast. 90 tablet 1   Melatonin 10 MG TABS Take by mouth.     memantine (NAMENDA) 10 MG tablet Take 1 tablet every night for 2 weeks, then increase to 1 tablet twice a day 60 tablet 11   Multiple Vitamin (MULTIVITAMIN) tablet Take 1 tablet by mouth daily.     Naproxen Sodium (ALEVE PO) Take by mouth daily.     NON FORMULARY CPAP     No current facility-administered medications on file prior to visit.     Review of Systems  Constitutional:  Negative for activity change, appetite change, fatigue, fever and unexpected weight change.  HENT:  Negative for congestion, rhinorrhea, sore throat and trouble swallowing.   Eyes:  Negative for pain, redness, itching and visual disturbance.  Respiratory:  Negative for cough, chest tightness, shortness of breath and wheezing.   Cardiovascular:  Negative for chest pain and palpitations.  Gastrointestinal:  Negative for abdominal pain, blood in stool, constipation, diarrhea and nausea.  Endocrine: Negative for cold intolerance, heat intolerance, polydipsia and polyuria.  Genitourinary:  Negative for difficulty urinating, dysuria, frequency and urgency.  Musculoskeletal:  Negative for arthralgias, joint swelling and myalgias.  Skin:  Positive for wound. Negative for pallor and rash.  Neurological:  Negative for dizziness, tremors, weakness, numbness and headaches.  Hematological:  Negative for adenopathy. Does not bruise/bleed easily.  Psychiatric/Behavioral:  Negative for decreased concentration and dysphoric mood. The patient is not nervous/anxious.       Objective:   Physical Exam Constitutional:      General: He is not in acute distress.    Appearance: Normal appearance. He is obese. He is not ill-appearing or diaphoretic.  Eyes:     General: No scleral icterus.    Conjunctiva/sclera: Conjunctivae normal.     Pupils: Pupils are equal, round, and reactive to light.  Cardiovascular:     Rate and Rhythm: Normal rate and regular rhythm.   Pulmonary:     Effort: Pulmonary effort is normal. No respiratory distress.  Musculoskeletal:     Cervical back: Normal range of motion and neck supple. No tenderness.  Lymphadenopathy:     Cervical: No cervical adenopathy.  Skin:    Coloration: Skin is not pale.     Findings: No rash.     Comments: Posterior neck: healing abscess with oval opening with some granulation tissue  Not actively draining  Not fluctuant , no pus Minimally tender  Less than 1 cm collar of erythema and no satellite areas  Neurological:     Mental Status: He is alert.     Cranial Nerves: No cranial nerve deficit.  Psychiatric:        Mood and Affect: Mood normal.          Assessment & Plan:   Problem List Items Addressed This Visit       Other   Abscess of neck - Primary    This continues to improve, s/p course of doxycycline  Discussed dressing changes / clean with soap and water and do not submerge Watch for increased redness or swelling or drainage Update if no further improvement  F/u in 2 wk for re check  Reassuring a1c today      Elevated glucose level    Lab Results  Component Value Date   HGBA1C 5.8 (A) 06/04/2021  Improved disc imp of low glycemic diet and wt loss to prevent DM2       Relevant Orders   POCT glycosylated hemoglobin (Hb A1C) (Completed)

## 2021-06-06 NOTE — Assessment & Plan Note (Signed)
Lab Results  Component Value Date   HGBA1C 5.8 (A) 06/04/2021   Improved disc imp of low glycemic diet and wt loss to prevent DM2

## 2021-06-06 NOTE — Assessment & Plan Note (Signed)
This continues to improve, s/p course of doxycycline  Discussed dressing changes / clean with soap and water and do not submerge Watch for increased redness or swelling or drainage Update if no further improvement  F/u in 2 wk for re check  Reassuring a1c today

## 2021-06-12 ENCOUNTER — Other Ambulatory Visit: Payer: Self-pay | Admitting: Neurology

## 2021-06-21 ENCOUNTER — Ambulatory Visit: Payer: 59 | Admitting: Family Medicine

## 2021-06-28 ENCOUNTER — Encounter: Payer: Self-pay | Admitting: Neurology

## 2021-06-28 ENCOUNTER — Other Ambulatory Visit: Payer: Self-pay

## 2021-06-28 ENCOUNTER — Ambulatory Visit: Payer: 59 | Admitting: Neurology

## 2021-06-28 VITALS — BP 117/78 | HR 81 | Ht 73.0 in | Wt 229.0 lb

## 2021-06-28 DIAGNOSIS — G3109 Other frontotemporal dementia: Secondary | ICD-10-CM

## 2021-06-28 DIAGNOSIS — G4733 Obstructive sleep apnea (adult) (pediatric): Secondary | ICD-10-CM

## 2021-06-28 DIAGNOSIS — F02818 Dementia in other diseases classified elsewhere, unspecified severity, with other behavioral disturbance: Secondary | ICD-10-CM

## 2021-06-28 MED ORDER — MEMANTINE HCL 10 MG PO TABS: ORAL_TABLET | ORAL | 3 refills | Status: DC

## 2021-06-28 MED ORDER — DIVALPROEX SODIUM ER 250 MG PO TB24: ORAL_TABLET | ORAL | 3 refills | Status: DC

## 2021-06-28 MED ORDER — ESCITALOPRAM OXALATE 20 MG PO TABS: ORAL_TABLET | ORAL | 3 refills | Status: DC

## 2021-06-28 MED ORDER — DONEPEZIL HCL 10 MG PO TABS: ORAL_TABLET | ORAL | 3 refills | Status: DC

## 2021-06-28 NOTE — Patient Instructions (Signed)
Good to see you! ? ?Continue all your medications ? ?2. Referral will be sent to Speech Therapy for cognitive therapy ? ?3. Refer to sleep specialist to follow-up on sleep apnea ? ?4. Follow-up in 6 months, call for any changes ? ? ?FALL PRECAUTIONS: Be cautious when walking. Scan the area for obstacles that may increase the risk of trips and falls. When getting up in the mornings, sit up at the edge of the bed for a few minutes before getting out of bed. Consider elevating the bed at the head end to avoid drop of blood pressure when getting up. Walk always in a well-lit room (use night lights in the walls). Avoid area rugs or power cords from appliances in the middle of the walkways. Use a walker or a cane if necessary and consider physical therapy for balance exercise. Get your eyesight checked regularly. ? ?HOME SAFETY: Consider the safety of the kitchen when operating appliances like stoves, microwave oven, and blender. Consider having supervision and share cooking responsibilities until no longer able to participate in those. Accidents with firearms and other hazards in the house should be identified and addressed as well. ? ?ABILITY TO BE LEFT ALONE: If patient is unable to contact 911 operator, consider using LifeLine, or when the need is there, arrange for someone to stay with patients. Smoking is a fire hazard, consider supervision or cessation. Risk of wandering should be assessed by caregiver and if detected at any point, supervision and safe proof recommendations should be instituted. ? ? ?RECOMMENDATIONS FOR ALL PATIENTS WITH MEMORY PROBLEMS: ?1. Continue to exercise (Recommend 30 minutes of walking everyday, or 3 hours every week) ?2. Increase social interactions - continue going to [place] and enjoy social gatherings with friends and family ?3. Eat healthy, avoid fried foods and eat more fruits and vegetables ?4. Maintain adequate blood pressure, blood sugar, and blood cholesterol level. Reducing the  risk of stroke and cardiovascular disease also helps promoting better memory. ?5. Avoid stressful situations. Live a simple life and avoid aggravations. Organize your time and prepare for the next day in anticipation. ?6. Sleep well, avoid any interruptions of sleep and avoid any distractions in the bedroom that may interfere with adequate sleep quality ?7. Avoid sugar, avoid sweets as there is a strong link between excessive sugar intake, diabetes, and cognitive impairment ?We discussed the Mediterranean diet, which has been shown to help patients reduce the risk of progressive memory disorders and reduces cardiovascular risk. This includes eating fish, eat fruits and green leafy vegetables, nuts like almonds and hazelnuts, walnuts, and also use olive oil. Avoid fast foods and fried foods as much as possible. Avoid sweets and sugar as sugar use has been linked to worsening of memory function. ? ?There is always a concern of gradual progression of memory problems. If this is the case, then we may need to adjust level of care according to patient needs. Support, both to the patient and caregiver, should then be put into place. ? ?
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
## 2021-07-31 ENCOUNTER — Other Ambulatory Visit: Payer: Self-pay | Admitting: Neurology

## 2021-10-07 ENCOUNTER — Telehealth: Payer: Self-pay | Admitting: Family Medicine

## 2021-10-07 NOTE — Telephone Encounter (Signed)
Last TSH checked on 12/19/19

## 2021-10-07 NOTE — Telephone Encounter (Signed)
Please schedule annual exam  

## 2021-10-08 NOTE — Telephone Encounter (Signed)
Please schedule appointment as instructed by PCP.

## 2021-10-11 NOTE — Telephone Encounter (Signed)
Pt scheduled for CPE on 10/21/21

## 2021-10-21 ENCOUNTER — Encounter: Payer: Self-pay | Admitting: Family Medicine

## 2021-10-21 ENCOUNTER — Ambulatory Visit (INDEPENDENT_AMBULATORY_CARE_PROVIDER_SITE_OTHER): Payer: 59 | Admitting: Family Medicine

## 2021-10-21 VITALS — BP 98/68 | HR 69 | Ht 72.0 in | Wt 237.5 lb

## 2021-10-21 DIAGNOSIS — Z125 Encounter for screening for malignant neoplasm of prostate: Secondary | ICD-10-CM

## 2021-10-21 DIAGNOSIS — Z8042 Family history of malignant neoplasm of prostate: Secondary | ICD-10-CM

## 2021-10-21 DIAGNOSIS — R7309 Other abnormal glucose: Secondary | ICD-10-CM | POA: Diagnosis not present

## 2021-10-21 DIAGNOSIS — Z1211 Encounter for screening for malignant neoplasm of colon: Secondary | ICD-10-CM | POA: Diagnosis not present

## 2021-10-21 DIAGNOSIS — F0280 Dementia in other diseases classified elsewhere without behavioral disturbance: Secondary | ICD-10-CM

## 2021-10-21 DIAGNOSIS — E039 Hypothyroidism, unspecified: Secondary | ICD-10-CM | POA: Diagnosis not present

## 2021-10-21 DIAGNOSIS — Z0000 Encounter for general adult medical examination without abnormal findings: Secondary | ICD-10-CM | POA: Diagnosis not present

## 2021-10-21 DIAGNOSIS — R413 Other amnesia: Secondary | ICD-10-CM

## 2021-10-21 DIAGNOSIS — G4733 Obstructive sleep apnea (adult) (pediatric): Secondary | ICD-10-CM

## 2021-10-21 DIAGNOSIS — E7800 Pure hypercholesterolemia, unspecified: Secondary | ICD-10-CM | POA: Diagnosis not present

## 2021-10-21 DIAGNOSIS — G3109 Other frontotemporal dementia: Secondary | ICD-10-CM

## 2021-10-21 LAB — LIPID PANEL
Cholesterol: 177 mg/dL (ref 0–200)
HDL: 47.4 mg/dL (ref >39.00)
LDL Cholesterol: 109 mg/dL — ABNORMAL HIGH (ref 0–99)
NonHDL: 129.27
Total CHOL/HDL Ratio: 4
Triglycerides: 102 mg/dL (ref 0.0–149.0)
VLDL: 20.4 mg/dL (ref 0.0–40.0)

## 2021-10-21 LAB — COMPREHENSIVE METABOLIC PANEL
ALT: 28 U/L (ref 0–53)
AST: 27 U/L (ref 0–37)
Albumin: 4.3 g/dL (ref 3.5–5.2)
Alkaline Phosphatase: 52 U/L (ref 39–117)
BUN: 18 mg/dL (ref 6–23)
CO2: 24 mEq/L (ref 19–32)
Calcium: 9 mg/dL (ref 8.4–10.5)
Chloride: 108 mEq/L (ref 96–112)
Creatinine, Ser: 1.26 mg/dL (ref 0.40–1.50)
GFR: 60.13 mL/min (ref >60.00)
Glucose, Bld: 100 mg/dL — ABNORMAL HIGH (ref 70–99)
Potassium: 4.4 mEq/L (ref 3.5–5.1)
Sodium: 141 mEq/L (ref 135–145)
Total Bilirubin: 0.4 mg/dL (ref 0.2–1.2)
Total Protein: 6.5 g/dL (ref 6.0–8.3)

## 2021-10-21 LAB — CBC WITH DIFFERENTIAL/PLATELET
Basophils Absolute: 0.1 10*3/uL (ref 0.0–0.1)
Basophils Relative: 1.1 % (ref 0.0–3.0)
Eosinophils Absolute: 0.2 10*3/uL (ref 0.0–0.7)
Eosinophils Relative: 3.6 % (ref 0.0–5.0)
HCT: 43.1 % (ref 39.0–52.0)
Hemoglobin: 14.4 g/dL (ref 13.0–17.0)
Lymphocytes Relative: 38.5 % (ref 12.0–46.0)
Lymphs Abs: 2.2 10*3/uL (ref 0.7–4.0)
MCHC: 33.4 g/dL (ref 30.0–36.0)
MCV: 98.6 fl (ref 78.0–100.0)
Monocytes Absolute: 0.5 10*3/uL (ref 0.1–1.0)
Monocytes Relative: 9.1 % (ref 3.0–12.0)
Neutro Abs: 2.7 10*3/uL (ref 1.4–7.7)
Neutrophils Relative %: 47.7 % (ref 43.0–77.0)
Platelets: 153 10*3/uL (ref 150.0–400.0)
RBC: 4.37 Mil/uL (ref 4.22–5.81)
RDW: 12.9 % (ref 11.5–15.5)
WBC: 5.7 10*3/uL (ref 4.0–10.5)

## 2021-10-21 LAB — TSH: TSH: 6.51 u[IU]/mL — ABNORMAL HIGH (ref 0.35–5.50)

## 2021-10-21 LAB — PSA: PSA: 0.95 ng/mL (ref 0.10–4.00)

## 2021-10-21 LAB — HEMOGLOBIN A1C: Hgb A1c MFr Bld: 5.8 % (ref 4.6–6.5)

## 2021-10-21 NOTE — Progress Notes (Signed)
Subjective:    Patient ID: Miguel Mendez, male    DOB: [birth year], 66 y.o.   MRN: [id number]  HPI Here for health maintenance exam and to review chronic medical problems    Wt Readings from Last 3 Encounters:  10/21/21 237 lb 8 oz (107.7 kg)  06/28/21 229 lb (103.9 kg)  06/04/21 233 lb 2 oz (105.7 kg)   32.21 kg/m  Lost some wt and put some back on  Is going to the gym  Walking also Weight machines  Enjoys that   Appetite is ok but has to be reminded to eat    Immunization History  Administered Date(s) Administered   Influenza, Quadrivalent, Recombinant, Inj, Pf 03/04/2018   PFIZER(Purple Top)SARS-COV-2 Vaccination 06/12/2019, 07/05/2019, 03/02/2021   Pneumococcal Polysaccharide-23 03/04/2018   Td 04/26/2013   Tdap 08/21/2014   Zoster Recombinat (Shingrix) 02/09/2019, 05/18/2019    Colonoscopy 12/2016 with 3 y recall/ is due Wants to schedule that   Prostate health Father had prostate and bladder cancer? Brother with prostate cancer Saw urology in the past  Did not follow up  Needs a psa   Per pt no urinary changes  Nocturia (may be behavioral) - more than once    Seeing neurology for frontotemporal dementia  Dr Delice Lesch Donepezil 10 mg daily  Memantine 10 mg bid Lexapro 30 mg daily  Depakote 250 mg bid  Is helping    Was ref to sleep medicine for OSA  Pt decline and will not use cpap    BP Readings from Last 3 Encounters:  10/21/21 98/68  06/28/21 117/78  06/04/21 126/76   Pulse Readings from Last 3 Encounters:  10/21/21 69  06/28/21 81  06/04/21 69   Hearing Screening   '500Hz'$  '4000Hz'$   Right ear 40 40  Left ear 40 40   Vision Screening   Right eye Left eye Both eyes  Without correction '20/30 20/30 20/30 '$  With correction        Hypothyroidism  Pt has no clinical changes No change in energy level/ hair or skin/ edema and no tremor Lab Results  Component Value Date   TSH 2.00 12/19/2019     No difference  Occ misses a dose of  levothyroxine 88 mcg (not often)   Hyperlipidemia Lab Results  Component Value Date   CHOL 155 12/19/2019   HDL 40.90 12/19/2019   LDLCALC 83 12/19/2019   LDLDIRECT 149.3 10/31/2007   TRIG 158.0 (H) 12/19/2019   CHOLHDL 4 12/19/2019   Eating fairly well  Chicken-grilled Salads Some milk shakes   Lab Results  Component Value Date   HGBA1C 5.8 (A) 06/04/2021   Patient Active Problem List   Diagnosis Date Noted   Routine general medical examination at a health care facility 10/21/2021   Prostate cancer screening 10/21/2021   Abscess of neck 05/26/2021   Frontotemporal dementia ([place]) 12/19/2019   Colon cancer screening 10/04/2016   Elevated glucose level 10/04/2016   Obesity (BMI 30.0-34.9) 10/04/2016   Hypothyroidism 11/21/2013   Family history of prostate cancer 10/30/2012   OBSTRUCTIVE SLEEP APNEA 11/08/2007   Low testosterone 10/30/2007   Hyperlipidemia 10/30/2007   Past Medical History:  Diagnosis Date   Acute upper respiratory infections of unspecified site    Family history of malignant neoplasm of prostate    Hyperlipidemia    Other malaise and fatigue    Other testicular hypofunction    Sleep apnea    CPAP use   Thyroid disease  Tobacco use disorder    Past Surgical History:  Procedure Laterality Date   FINGER FRACTURE SURGERY Left    middle   KNEE SURGERY Right    SHOULDER SURGERY Bilateral    Social History   Tobacco Use   Smoking status: Former    Types: Cigars   Smokeless tobacco: Never   Tobacco comments:    quit 03/2013  Vaping Use   Vaping Use: Never used  Substance Use Topics   Alcohol use: Yes    Comment: Rare   Drug use: No   Family History  Problem Relation Age of Onset   Prostate cancer Father    Coronary artery disease Father    Bladder Cancer Father    Prostate cancer Brother    Coronary artery disease Maternal Grandfather    Coronary artery disease Paternal Grandfather        in old age   Diabetes Cousin    Diabetes  Son    Colon cancer Paternal Uncle 62   Allergies  Allergen Reactions   Bee Venom Swelling   Current Outpatient Medications on File Prior to Visit  Medication Sig Dispense Refill   diphenhydramine-acetaminophen (TYLENOL PM) 25-500 MG TABS tablet Take 1 tablet by mouth at bedtime as needed.     divalproex (DEPAKOTE ER) 250 MG 24 hr tablet TAKE ONE TABLET BY MOUTH EVERY EVENING FOR 7 DAYS THEN INCREASE TO TAKE ONE TABLET BY MOUTH TWICE A DAY 60 tablet 1   donepezil (ARICEPT) 10 MG tablet TAKE ONE TABLET BY MOUTH DAILY 90 tablet 3   escitalopram (LEXAPRO) 20 MG tablet Take 1 and 1/2 tablets daily 135 tablet 3   Glucosamine-Chondroitin (OSTEO BI-FLEX REGULAR STRENGTH PO) Take by mouth. 2 a day     levothyroxine (SYNTHROID) 88 MCG tablet TAKE ONE TABLET BY MOUTH DAILY WITH BREAKFAST 90 tablet 0   Melatonin 10 MG TABS Take by mouth.     memantine (NAMENDA) 10 MG tablet Take 1 tablet twice a day 180 tablet 3   Multiple Vitamin (MULTIVITAMIN) tablet Take 1 tablet by mouth daily.     Naproxen Sodium (ALEVE PO) Take by mouth daily.     NON FORMULARY CPAP     No current facility-administered medications on file prior to visit.    Review of Systems  Constitutional:  Positive for fatigue. Negative for activity change, appetite change, fever and unexpected weight change.  HENT:  Negative for congestion, rhinorrhea, sore throat and trouble swallowing.   Eyes:  Negative for pain, redness, itching and visual disturbance.  Respiratory:  Negative for cough, chest tightness, shortness of breath and wheezing.   Cardiovascular:  Negative for chest pain and palpitations.  Gastrointestinal:  Negative for abdominal pain, blood in stool, constipation, diarrhea and nausea.  Endocrine: Negative for cold intolerance, heat intolerance, polydipsia and polyuria.  Genitourinary:  Negative for difficulty urinating, dysuria, frequency and urgency.  Musculoskeletal:  Negative for arthralgias, joint swelling and  myalgias.  Skin:  Negative for pallor and rash.  Neurological:  Negative for dizziness, tremors, weakness, numbness and headaches.  Hematological:  Negative for adenopathy. Does not bruise/bleed easily.  Psychiatric/Behavioral:  Positive for decreased concentration and dysphoric mood. Negative for confusion. The patient is not nervous/anxious.        Dementia   Mood/behavioral change is stable No agitation today       Objective:   Physical Exam Constitutional:      General: He is not in acute distress.    Appearance: Normal  appearance. He is well-developed. He is obese. He is not ill-appearing or diaphoretic.  HENT:     Head: Normocephalic and atraumatic.     Right Ear: Tympanic membrane, ear canal and external ear normal.     Left Ear: Tympanic membrane, ear canal and external ear normal.     Nose: Nose normal. No congestion.     Mouth/Throat:     Mouth: Mucous membranes are moist.     Pharynx: Oropharynx is clear. No posterior oropharyngeal erythema.  Eyes:     General: No scleral icterus.       Right eye: No discharge.        Left eye: No discharge.     Conjunctiva/sclera: Conjunctivae normal.     Pupils: Pupils are equal, round, and reactive to light.  Neck:     Thyroid: No thyromegaly.     Vascular: No carotid bruit or JVD.  Cardiovascular:     Rate and Rhythm: Normal rate and regular rhythm.     Pulses: Normal pulses.     Heart sounds: Normal heart sounds.     No gallop.  Pulmonary:     Effort: Pulmonary effort is normal. No respiratory distress.     Breath sounds: Normal breath sounds. No wheezing or rales.     Comments: Good air exch Chest:     Chest wall: No tenderness.  Abdominal:     General: Bowel sounds are normal. There is no distension or abdominal bruit.     Palpations: Abdomen is soft. There is no mass.     Tenderness: There is no abdominal tenderness.     Hernia: No hernia is present.  Musculoskeletal:        General: No tenderness.     Cervical  back: Normal range of motion and neck supple. No rigidity. No muscular tenderness.     Right lower leg: No edema.     Left lower leg: No edema.  Lymphadenopathy:     Cervical: No cervical adenopathy.  Skin:    General: Skin is warm and dry.     Coloration: Skin is not pale.     Findings: No erythema or rash.     Comments: Solar lentigines diffusely   Neurological:     Mental Status: He is alert.     Cranial Nerves: No cranial nerve deficit.     Motor: No abnormal muscle tone.     Coordination: Coordination normal.     Gait: Gait normal.     Deep Tendon Reflexes: Reflexes are normal and symmetric. Reflexes normal.  Psychiatric:        Mood and Affect: Mood normal. Affect is flat.        Speech: Speech normal.        Behavior: Behavior normal.        Cognition and Memory: He exhibits impaired recent memory.     Comments: Calm and pleasant today  Answers questions appropriately           Assessment & Plan:   Problem List Items Addressed This Visit       Respiratory   OBSTRUCTIVE SLEEP APNEA    Pt declines further evaluation or cpap          Endocrine   Hypothyroidism    Hypothyroidism  Pt has no clinical changes No change in energy level/ hair or skin/ edema and no tremor TSH ordered Taking levothyroxine 88 mcg daily      Relevant Orders   TSH (Completed)  Nervous and Auditory   Frontotemporal dementia ([place])    Frontotemporal dementia  Seeing neurology and stable (with stable mood)  Care at home   Donepezil 10 mg daily  Memantine 10 mg bid Lexapro 30 mg daily  Depakote 250 mg bid         Other   Colon cancer screening    Overdue for 3 y recall from 2018  GI referral done-pt will call to schedule      Relevant Orders   Ambulatory referral to Gastroenterology   Elevated glucose level    A1C ordered disc imp of low glycemic diet and wt loss to prevent DM2       Relevant Orders   Hemoglobin A1c (Completed)   Family history of prostate  cancer    Pt has not seen urology for over a year psa ordered       Relevant Orders   PSA (Completed)   Hyperlipidemia    Disc goals for lipids and reasons to control them Rev last labs with pt Rev low sat fat diet in detail  Eating fairly well but some milk shakes        Relevant Orders   Lipid panel (Completed)   Prostate cancer screening    Has not seen urology in over a year  No urinary changes  psa ordered       Relevant Orders   PSA (Completed)   Routine general medical examination at a health care facility - Primary    Reviewed health habits including diet and exercise and skin cancer prevention Reviewed appropriate screening tests for age  Also reviewed health mt list, fam hx and immunization status , as well as social and family history   Referral done for recall colonoscopy  psa ordered  Declines tx for OSA  In tx for dementia bp is low normal- no dizziness  Labs ordered  Hearing and vision screens are stable  Encouraged low glycemic diet       Relevant Orders   CBC with Differential/Platelet (Completed)   Comprehensive metabolic panel (Completed)   Lipid panel (Completed)   TSH (Completed)

## 2021-10-21 NOTE — Patient Instructions (Addendum)
Eat a healthy balanced diet Keep exercising   Try to get most of your carbohydrates from produce (with the exception of white potatoes)  Eat less bread/pasta/rice/snack foods/cereals/sweets and other items from the middle of the grocery store (processed carbs)  Use sunscreen   Labs today    Go ahead and call GI to schedule your colonoscopy   [facility]  [phone]

## 2021-10-22 NOTE — Assessment & Plan Note (Signed)
Has not seen urology in over a year  No urinary changes  psa ordered

## 2021-10-22 NOTE — Assessment & Plan Note (Signed)
Pt declines further evaluation or cpap

## 2021-10-22 NOTE — Assessment & Plan Note (Signed)
A1C ordered °disc imp of low glycemic diet and wt loss to prevent DM2  °

## 2021-10-22 NOTE — Assessment & Plan Note (Signed)
Reviewed health habits including diet and exercise and skin cancer prevention Reviewed appropriate screening tests for age  Also reviewed health mt list, fam hx and immunization status , as well as social and family history   Referral done for recall colonoscopy  psa ordered  Declines tx for OSA  In tx for dementia bp is low normal- no dizziness  Labs ordered  Hearing and vision screens are stable  Encouraged low glycemic diet

## 2021-10-22 NOTE — Assessment & Plan Note (Signed)
Disc goals for lipids and reasons to control them Rev last labs with pt Rev low sat fat diet in detail  Eating fairly well but some milk shakes

## 2021-10-22 NOTE — Assessment & Plan Note (Signed)
Pt has not seen urology for over a year psa ordered

## 2021-10-22 NOTE — Assessment & Plan Note (Signed)
Frontotemporal dementia  Seeing neurology and stable (with stable mood)  Care at home   Donepezil 10 mg daily  Memantine 10 mg bid Lexapro 30 mg daily  Depakote 250 mg bid

## 2021-10-22 NOTE — Assessment & Plan Note (Signed)
Overdue for 3 y recall from 2018  GI referral done-pt will call to schedule

## 2021-10-22 NOTE — Assessment & Plan Note (Signed)
Hypothyroidism  Pt has no clinical changes No change in energy level/ hair or skin/ edema and no tremor TSH ordered Taking levothyroxine 88 mcg daily

## 2021-12-05 ENCOUNTER — Telehealth: Payer: Self-pay | Admitting: Family Medicine

## 2021-12-05 DIAGNOSIS — E039 Hypothyroidism, unspecified: Secondary | ICD-10-CM

## 2021-12-05 NOTE — Telephone Encounter (Signed)
-----   Message from Ellamae Sia sent at 11/22/2021  3:39 PM EDT ----- Regarding: Lab orders for Monday, 8.14.23 Lab order for TSH? Thanks

## 2021-12-06 ENCOUNTER — Other Ambulatory Visit: Payer: 59

## 2021-12-29 ENCOUNTER — Encounter: Payer: Self-pay | Admitting: Neurology

## 2021-12-31 ENCOUNTER — Encounter: Payer: Self-pay | Admitting: Neurology

## 2021-12-31 ENCOUNTER — Ambulatory Visit (INDEPENDENT_AMBULATORY_CARE_PROVIDER_SITE_OTHER): Payer: Medicare Other | Admitting: Neurology

## 2021-12-31 VITALS — BP 126/83 | HR 97 | Ht 73.0 in | Wt 232.0 lb

## 2021-12-31 DIAGNOSIS — F02818 Dementia in other diseases classified elsewhere, unspecified severity, with other behavioral disturbance: Secondary | ICD-10-CM

## 2021-12-31 DIAGNOSIS — G3109 Other frontotemporal dementia: Secondary | ICD-10-CM

## 2021-12-31 MED ORDER — MEMANTINE HCL 10 MG PO TABS: ORAL_TABLET | ORAL | 3 refills | Status: DC

## 2021-12-31 MED ORDER — DIVALPROEX SODIUM ER 250 MG PO TB24: ORAL_TABLET | ORAL | 3 refills | Status: DC

## 2021-12-31 MED ORDER — DONEPEZIL HCL 10 MG PO TABS: ORAL_TABLET | ORAL | 3 refills | Status: DC

## 2021-12-31 MED ORDER — ESCITALOPRAM OXALATE 20 MG PO TABS: ORAL_TABLET | ORAL | 3 refills | Status: DC
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
## 2021-12-31 NOTE — Patient Instructions (Signed)
Good to see you.  Increase Depakote '250mg'$ : take 1 tablet in AM, 2 tablets in PM  2. Continue Donepezil '10mg'$  daily, Memantine '10mg'$  twice a day, and Lexapro '20mg'$ : take 1 and 1/2 tablets daily  3. Increase physical activity during the day, look into day programs  4. Follow-up in 6 months, call for any changes   FALL PRECAUTIONS: Be cautious when walking. Scan the area for obstacles that may increase the risk of trips and falls. When getting up in the mornings, sit up at the edge of the bed for a few minutes before getting out of bed. Consider elevating the bed at the head end to avoid drop of blood pressure when getting up. Walk always in a well-lit room (use night lights in the walls). Avoid area rugs or power cords from appliances in the middle of the walkways. Use a walker or a cane if necessary and consider physical therapy for balance exercise. Get your eyesight checked regularly.   HOME SAFETY: Consider the safety of the kitchen when operating appliances like stoves, microwave oven, and blender. Consider having supervision and share cooking responsibilities until no longer able to participate in those. Accidents with firearms and other hazards in the house should be identified and addressed as well.   ABILITY TO BE LEFT ALONE: If patient is unable to contact 911 operator, consider using LifeLine, or when the need is there, arrange for someone to stay with patients. Smoking is a fire hazard, consider supervision or cessation. Risk of wandering should be assessed by caregiver and if detected at any point, supervision and safe proof recommendations should be instituted.   RECOMMENDATIONS FOR ALL PATIENTS WITH MEMORY PROBLEMS: 1. Continue to exercise (Recommend 30 minutes of walking everyday, or 3 hours every week) 2. Increase social interactions - continue going to [place] and enjoy social gatherings with friends and family 3. Eat healthy, avoid fried foods and eat more fruits and  vegetables 4. Maintain adequate blood pressure, blood sugar, and blood cholesterol level. Reducing the risk of stroke and cardiovascular disease also helps promoting better memory. 5. Avoid stressful situations. Live a simple life and avoid aggravations. Organize your time and prepare for the next day in anticipation. 6. Sleep well, avoid any interruptions of sleep and avoid any distractions in the bedroom that may interfere with adequate sleep quality 7. Avoid sugar, avoid sweets as there is a strong link between excessive sugar intake, diabetes, and cognitive impairment The Mediterranean diet has been shown to help patients reduce the risk of progressive memory disorders and reduces cardiovascular risk. This includes eating fish, eat fruits and green leafy vegetables, nuts like almonds and hazelnuts, walnuts, and also use olive oil. Avoid fast foods and fried foods as much as possible. Avoid sweets and sugar as sugar use has been linked to worsening of memory function.  There is always a concern of gradual progression of memory problems. If this is the case, then we may need to adjust level of care according to patient needs. Support, both to the patient and caregiver, should then be put into place.

## 2022-01-10 ENCOUNTER — Other Ambulatory Visit: Payer: Self-pay | Admitting: Family Medicine

## 2022-05-11 ENCOUNTER — Encounter: Payer: Self-pay | Admitting: Neurology

## 2022-07-15 ENCOUNTER — Encounter: Payer: Self-pay | Admitting: Neurology

## 2022-07-18 ENCOUNTER — Encounter: Payer: Self-pay | Admitting: Neurology

## 2022-07-18 ENCOUNTER — Ambulatory Visit (INDEPENDENT_AMBULATORY_CARE_PROVIDER_SITE_OTHER): Payer: Medicare Other | Admitting: Neurology

## 2022-07-18 VITALS — BP 116/76 | HR 98 | Ht 73.0 in | Wt 226.4 lb

## 2022-07-18 DIAGNOSIS — F02818 Dementia in other diseases classified elsewhere, unspecified severity, with other behavioral disturbance: Secondary | ICD-10-CM | POA: Diagnosis not present

## 2022-07-18 DIAGNOSIS — G3109 Other frontotemporal dementia: Secondary | ICD-10-CM | POA: Diagnosis not present

## 2022-07-18 MED ORDER — DIVALPROEX SODIUM ER 250 MG PO TB24: ORAL_TABLET | ORAL | 3 refills | Status: DC

## 2022-07-18 MED ORDER — MEMANTINE HCL 10 MG PO TABS: ORAL_TABLET | ORAL | 3 refills | Status: DC

## 2022-07-18 MED ORDER — ESCITALOPRAM OXALATE 20 MG PO TABS: ORAL_TABLET | ORAL | 3 refills | Status: DC

## 2022-07-18 NOTE — Patient Instructions (Addendum)
Good to see you.  Wean off Donepezil to 1/2 tablet daily for 1 week, then stop  2. We can also switch to a different antidepressant, but this will require weaning off the Lexapro first  3. Continue Depakote and Memantine  4. Recommend looking into day programs locally  5. Follow-up in 6 months, call for any changes  FALL PRECAUTIONS: Be cautious when walking. Scan the area for obstacles that may increase the risk of trips and falls. When getting up in the mornings, sit up at the edge of the bed for a few minutes before getting out of bed. Consider elevating the bed at the head end to avoid drop of blood pressure when getting up. Walk always in a well-lit room (use night lights in the walls). Avoid area rugs or power cords from appliances in the middle of the walkways. Use a walker or a cane if necessary and consider physical therapy for balance exercise. Get your eyesight checked regularly.   HOME SAFETY: Consider the safety of the kitchen when operating appliances like stoves, microwave oven, and blender. Consider having supervision and share cooking responsibilities until no longer able to participate in those. Accidents with firearms and other hazards in the house should be identified and addressed as well.   ABILITY TO BE LEFT ALONE: If patient is unable to contact 911 operator, consider using LifeLine, or when the need is there, arrange for someone to stay with patients. Smoking is a fire hazard, consider supervision or cessation. Risk of wandering should be assessed by caregiver and if detected at any point, supervision and safe proof recommendations should be instituted.  RECOMMENDATIONS FOR ALL PATIENTS WITH MEMORY PROBLEMS: 1. Continue to exercise (Recommend 30 minutes of walking everyday, or 3 hours every week) 2. Increase social interactions - continue going to [place] and enjoy social gatherings with friends and family 3. Eat healthy, avoid fried foods and eat more fruits and  vegetables 4. Maintain adequate blood pressure, blood sugar, and blood cholesterol level. Reducing the risk of stroke and cardiovascular disease also helps promoting better memory. 5. Avoid stressful situations. Live a simple life and avoid aggravations. Organize your time and prepare for the next day in anticipation. 6. Sleep well, avoid any interruptions of sleep and avoid any distractions in the bedroom that may interfere with adequate sleep quality 7. Avoid sugar, avoid sweets as there is a strong link between excessive sugar intake, diabetes, and cognitive impairment The Mediterranean diet has been shown to help patients reduce the risk of progressive memory disorders and reduces cardiovascular risk. This includes eating fish, eat fruits and green leafy vegetables, nuts like almonds and hazelnuts, walnuts, and also use olive oil. Avoid fast foods and fried foods as much as possible. Avoid sweets and sugar as sugar use has been linked to worsening of memory function.  There is always a concern of gradual progression of memory problems. If this is the case, then we may need to adjust level of care according to patient needs. Support, both to the patient and caregiver, should then be put into place.
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
## 2022-07-27 ENCOUNTER — Ambulatory Visit: Payer: Medicare Other | Admitting: Neurology

## 2022-08-15 ENCOUNTER — Encounter: Payer: Self-pay | Admitting: Neurology

## 2022-08-18 MED ORDER — DONEPEZIL HCL 10 MG PO TABS: ORAL_TABLET | ORAL | 3 refills | Status: DC

## 2022-08-19 ENCOUNTER — Ambulatory Visit (INDEPENDENT_AMBULATORY_CARE_PROVIDER_SITE_OTHER): Payer: Medicare Other | Admitting: Family Medicine

## 2022-08-19 ENCOUNTER — Encounter: Payer: Self-pay | Admitting: Family Medicine

## 2022-08-19 VITALS — BP 104/68 | HR 70 | Temp 98.1°F | Ht 73.0 in | Wt 225.0 lb

## 2022-08-19 DIAGNOSIS — F0280 Dementia in other diseases classified elsewhere without behavioral disturbance: Secondary | ICD-10-CM

## 2022-08-19 DIAGNOSIS — R110 Nausea: Secondary | ICD-10-CM | POA: Insufficient documentation

## 2022-08-19 DIAGNOSIS — R7309 Other abnormal glucose: Secondary | ICD-10-CM

## 2022-08-19 DIAGNOSIS — G3109 Other frontotemporal dementia: Secondary | ICD-10-CM

## 2022-08-19 DIAGNOSIS — E039 Hypothyroidism, unspecified: Secondary | ICD-10-CM | POA: Diagnosis not present

## 2022-08-19 DIAGNOSIS — R350 Frequency of micturition: Secondary | ICD-10-CM | POA: Diagnosis not present

## 2022-08-19 LAB — POC URINALSYSI DIPSTICK (AUTOMATED)
Bilirubin, UA: NEGATIVE
Blood, UA: NEGATIVE
Glucose, UA: NEGATIVE
Ketones, UA: NEGATIVE
Leukocytes, UA: NEGATIVE
Nitrite, UA: NEGATIVE
Protein, UA: NEGATIVE
Spec Grav, UA: 1.015 (ref 1.010–1.025)
Urobilinogen, UA: 0.2 E.U./dL
pH, UA: 6 (ref 5.0–8.0)

## 2022-08-19 LAB — POCT UA - MICROSCOPIC ONLY: RBC, Urine, Miroscopic: 0 (ref 0–2)

## 2022-08-19 MED ORDER — FAMOTIDINE 20 MG PO TABS: 20.0000 mg | ORAL_TABLET | Freq: Every day | ORAL | 1 refills | Status: DC

## 2022-08-19 NOTE — Assessment & Plan Note (Addendum)
No vomiting  No pain  Baseline ibs with urgent stools after eating May be multi factorial  No meds are new  Appetite ok but wt loss noted   Will try pepcid 20 mg in evening Plan lab in 2 wk then follow up   Encourage to call if symptoms worsen or change in meantime

## 2022-08-19 NOTE — Patient Instructions (Addendum)
The goal for thyorid medicine is to take it with water first thing when you wake up at least 30 minutes before you eat or take medicines or vitamins   Your urine is clear today  No infection  Keep up a good fluid intake   Try pepcid 20 mg in evening every day for stomach acid and see if this helps nausea   Plan labs in about 2 weeks (for thyroid and nausea) Then follow up with me a week later to review those and discuss frequent urination

## 2022-08-19 NOTE — Assessment & Plan Note (Signed)
Reviewed neuro note Is progressing  Continues   Donepezil 10 mg daily  Lexapro 30 mg daily for mood Memantine 10 mg bid  Melatonin-pm Tylenol pm prn  No uti today  Made aware that the tylenol pm may worsen urinary issues  None of meds new- doublt causing nausea

## 2022-08-19 NOTE — Progress Notes (Signed)
Subjective:    Patient ID: Miguel Mendez, male    DOB: Aug 31, 1955, 67 y.o.   MRN: [id number]  HPI Pt presents with urinary symptoms  Nausea   Some mental status changes (in setting of frontotemporal dementia)   Wt Readings from Last 3 Encounters:  08/19/22 225 lb (102.1 kg)  07/18/22 226 lb 6.4 oz (102.7 kg)  12/31/21 232 lb (105.2 kg)   29.69 kg/m  Vitals:   08/19/22 1229  BP: 104/68  Pulse: 70  Temp: 98.1 F (36.7 C)  SpO2: 98%   Urinary frequency - for a few weeks    During the day he urinates at least every 60-90 minutes  Unsure  at night- sleeping in separate beds (is awake a lot at night)  Urine does not look or smell different  No blood  No pain with urination   Some slowed urination   Nausea - complaining for 1-2 months now  Does not correspond with med changes  No vomiting  Eating the same  Has lost weight with the dementia        (some ibs- stool urgency after eating- not new)   Occ gas - uses gas ex No heartburn  Some burping   Takes tylenol pm at night Occ aleve   Has h/o low testosterone and BPH without LUTS  Sees urology-last visit was 2020  Lab Results  Component Value Date   PSA 0.95 10/21/2021    Frontotemporal dementia  Sees neurology   Donepezil 10 mg daily  Lexapro 30 mg daily for mood Memantine 10 mg bid  Melatonin-pm Tylenol pm prn  Hypothyroid Lab Results  Component Value Date   TSH 6.51 (H) 10/21/2021  Had missed doses at time of the draw  Levothyroxine 88 mcg    Results for orders placed or performed in visit on 08/19/22  POCT Urinalysis Dipstick (Automated)  Result Value Ref Range   Color, UA Yellow    Clarity, UA Clear    Glucose, UA Negative Negative   Bilirubin, UA Negative    Ketones, UA Negative    Spec Grav, UA 1.015 1.010 - 1.025   Blood, UA Negative    pH, UA 6.0 5.0 - 8.0   Protein, UA Negative Negative   Urobilinogen, UA 0.2 0.2 or 1.0 E.U./dL   Nitrite, UA Negative    Leukocytes, UA  Negative Negative  POCT UA - Microscopic Only  Result Value Ref Range   WBC, Ur, HPF, POC 0-1 0 - 5   RBC, Urine, Miroscopic 0 0 - 2   Bacteria, U Microscopic none None - Trace   Mucus, UA few    Epithelial cells, urine per micros few    Crystals, Ur, HPF, POC none    Casts, Ur, LPF, POC none    Yeast, UA none     Patient Active Problem List   Diagnosis Date Noted   Urinary frequency 08/19/2022   Nausea 08/19/2022   Routine general medical examination at a health care facility 10/21/2021   Prostate cancer screening 10/21/2021   Abscess of neck 05/26/2021   Frontotemporal dementia (HCC) 12/19/2019   Colon cancer screening 10/04/2016   Elevated glucose level 10/04/2016   Obesity (BMI 30.0-34.9) 10/04/2016   Hypothyroidism 11/21/2013   Family history of prostate cancer 10/30/2012   OBSTRUCTIVE SLEEP APNEA 11/08/2007   Low testosterone 10/30/2007   Hyperlipidemia 10/30/2007   Past Medical History:  Diagnosis Date   Acute upper respiratory infections of unspecified site  Family history of malignant neoplasm of prostate    Hyperlipidemia    Other malaise and fatigue    Other testicular hypofunction    Sleep apnea    CPAP use   Thyroid disease    Tobacco use disorder    Past Surgical History:  Procedure Laterality Date   FINGER FRACTURE SURGERY Left    middle   KNEE SURGERY Right    SHOULDER SURGERY Bilateral    Social History   Tobacco Use   Smoking status: Some Days    Types: Cigars, Cigarettes   Smokeless tobacco: Never   Tobacco comments:    quit 03/2013  Vaping Use   Vaping Use: Never used  Substance Use Topics   Alcohol use: Yes    Comment: Rare   Drug use: No   Family History  Problem Relation Age of Onset   Prostate cancer Father    Coronary artery disease Father    Bladder Cancer Father    Prostate cancer Brother    Coronary artery disease Maternal Grandfather    Coronary artery disease Paternal Grandfather        in old age   Diabetes  Cousin    Diabetes Son    Colon cancer Paternal Uncle 61   Allergies  Allergen Reactions   Bee Venom Swelling   Current Outpatient Medications on File Prior to Visit  Medication Sig Dispense Refill   diphenhydramine-acetaminophen (TYLENOL PM) 25-500 MG TABS tablet Take 1 tablet by mouth at bedtime as needed.     divalproex (DEPAKOTE ER) 250 MG 24 hr tablet Take 1 tablet in AM, 2 tablets in PM 270 tablet 3   donepezil (ARICEPT) 10 MG tablet Take 1 tablet daily 90 tablet 3   escitalopram (LEXAPRO) 20 MG tablet Take 1 and 1/2 tablets daily 135 tablet 3   Glucosamine-Chondroitin (OSTEO BI-FLEX REGULAR STRENGTH PO) Take by mouth. 2 a day     levothyroxine (SYNTHROID) 88 MCG tablet TAKE ONE TABLET BY MOUTH DAILY WITH BREAKFAST 90 tablet 0   Melatonin 10 MG TABS Take by mouth.     memantine (NAMENDA) 10 MG tablet Take 1 tablet twice a day 180 tablet 3   Multiple Vitamin (MULTIVITAMIN) tablet Take 1 tablet by mouth daily.     Naproxen Sodium (ALEVE PO) Take by mouth daily.     NON FORMULARY CPAP     No current facility-administered medications on file prior to visit.     Review of Systems  Constitutional:  Positive for fatigue. Negative for activity change, appetite change, fever and unexpected weight change.  HENT:  Negative for congestion, rhinorrhea, sore throat and trouble swallowing.   Eyes:  Negative for pain, redness, itching and visual disturbance.  Respiratory:  Negative for cough, chest tightness, shortness of breath and wheezing.   Cardiovascular:  Negative for chest pain and palpitations.  Gastrointestinal:  Positive for nausea. Negative for abdominal pain, blood in stool, constipation, diarrhea, rectal pain and vomiting.       Loose stool /urgent - is baseline  Endocrine: Negative for cold intolerance, heat intolerance, polydipsia and polyuria.  Genitourinary:  Negative for difficulty urinating, dysuria, frequency and urgency.  Musculoskeletal:  Negative for arthralgias, joint  swelling and myalgias.  Skin:  Negative for pallor and rash.  Neurological:  Negative for dizziness, tremors, weakness, numbness and headaches.  Hematological:  Negative for adenopathy. Does not bruise/bleed easily.  Psychiatric/Behavioral:  Positive for confusion and decreased concentration. Negative for agitation, behavioral problems and dysphoric mood.  The patient is not nervous/anxious.        Objective:   Physical Exam Constitutional:      General: He is not in acute distress.    Appearance: Normal appearance. He is well-developed. He is not ill-appearing or diaphoretic.     Comments: Overweight   HENT:     Head: Normocephalic and atraumatic.     Mouth/Throat:     Mouth: Mucous membranes are moist.  Eyes:     Conjunctiva/sclera: Conjunctivae normal.     Pupils: Pupils are equal, round, and reactive to light.  Neck:     Thyroid: No thyromegaly.     Vascular: No carotid bruit or JVD.  Cardiovascular:     Rate and Rhythm: Normal rate and regular rhythm.     Heart sounds: Normal heart sounds.     No gallop.  Pulmonary:     Effort: Pulmonary effort is normal. No respiratory distress.     Breath sounds: Normal breath sounds. No wheezing or rales.  Abdominal:     General: Abdomen is protuberant. There is no distension or abdominal bruit.     Palpations: Abdomen is soft. There is no hepatomegaly, splenomegaly or pulsatile mass.     Tenderness: There is no abdominal tenderness. There is no guarding or rebound. Negative signs include Murphy's sign and McBurney's sign.  Musculoskeletal:     Cervical back: Normal range of motion and neck supple.     Right lower leg: No edema.     Left lower leg: No edema.  Lymphadenopathy:     Cervical: No cervical adenopathy.  Skin:    General: Skin is warm and dry.     Coloration: Skin is not pale.     Findings: No rash.  Neurological:     Mental Status: He is alert.     Coordination: Coordination normal.     Deep Tendon Reflexes: Reflexes  are normal and symmetric. Reflexes normal.  Psychiatric:        Attention and Perception: Attention normal.        Mood and Affect: Mood normal. Affect is flat.        Cognition and Memory: Cognition is impaired. Memory is impaired.     Comments: Baseline- demeentia with very flat affect today Cooperative Pleasant            Assessment & Plan:   Problem List Items Addressed This Visit       Endocrine   Hypothyroidism    Long disc re: way to take levothy and separate from food/vita/meds  Will start it more regularly  May help mental status Lab Results  Component Value Date   TSH 6.51 (H) 10/21/2021  Will resume levothy 88 mcg more complaintly Lab 2 wk with f/u after         Nervous and Auditory   Frontotemporal dementia (HCC)    Reviewed neuro note Is progressing  Continues   Donepezil 10 mg daily  Lexapro 30 mg daily for mood Memantine 10 mg bid  Melatonin-pm Tylenol pm prn  No uti today  Made aware that the tylenol pm may worsen urinary issues  None of meds new- doublt causing nausea         Other   Nausea - Primary    No vomiting  No pain  Baseline ibs with urgent stools after eating May be multi factorial  No meds are new  Appetite ok but wt loss noted   Will try pepcid 20 mg in  evening Plan lab in 2 wk then follow up   Encourage to call if symptoms worsen or change in meantime       Urinary frequency    Ua dip and micro are clear  Suspect mild BPH symptoms Rev last note from urology in 2020  May be able to try low dose flomax in future- bp is mildly low / will discuss at f/u  Encouraged to alert [place] if symptoms change or worsen      Relevant Orders   POCT Urinalysis Dipstick (Automated) (Completed)   POCT UA - Microscopic Only (Completed)

## 2022-08-19 NOTE — Assessment & Plan Note (Signed)
Ua dip and micro are clear  Suspect mild BPH symptoms Rev last note from urology in 2020  May be able to try low dose flomax in future- bp is mildly low / will discuss at f/u  Encouraged to alert [place] if symptoms change or worsen

## 2022-08-19 NOTE — Assessment & Plan Note (Signed)
Long disc re: way to take levothy and separate from food/vita/meds  Will start it more regularly  May help mental status Lab Results  Component Value Date   TSH 6.51 (H) 10/21/2021   Will resume levothy 88 mcg more complaintly Lab 2 wk with f/u after

## 2022-08-29 ENCOUNTER — Other Ambulatory Visit: Payer: Medicare Other

## 2022-09-02 ENCOUNTER — Ambulatory Visit: Payer: Medicare Other | Admitting: Family Medicine

## 2022-09-07 ENCOUNTER — Other Ambulatory Visit (INDEPENDENT_AMBULATORY_CARE_PROVIDER_SITE_OTHER): Payer: Medicare Other

## 2022-09-07 ENCOUNTER — Other Ambulatory Visit: Payer: Medicare Other

## 2022-09-07 DIAGNOSIS — E039 Hypothyroidism, unspecified: Secondary | ICD-10-CM | POA: Diagnosis not present

## 2022-09-07 DIAGNOSIS — R7309 Other abnormal glucose: Secondary | ICD-10-CM

## 2022-09-07 DIAGNOSIS — R110 Nausea: Secondary | ICD-10-CM | POA: Diagnosis not present

## 2022-09-07 LAB — COMPREHENSIVE METABOLIC PANEL
ALT: 19 U/L (ref 0–53)
AST: 18 U/L (ref 0–37)
Albumin: 4.2 g/dL (ref 3.5–5.2)
Alkaline Phosphatase: 57 U/L (ref 39–117)
BUN: 7 mg/dL (ref 6–23)
CO2: 31 mEq/L (ref 19–32)
Calcium: 9 mg/dL (ref 8.4–10.5)
Chloride: 102 mEq/L (ref 96–112)
Creatinine, Ser: 1.26 mg/dL (ref 0.40–1.50)
GFR: 59.76 mL/min — ABNORMAL LOW (ref >60.00)
Glucose, Bld: 93 mg/dL (ref 70–99)
Potassium: 3.7 mEq/L (ref 3.5–5.1)
Sodium: 141 mEq/L (ref 135–145)
Total Bilirubin: 0.9 mg/dL (ref 0.2–1.2)
Total Protein: 6.8 g/dL (ref 6.0–8.3)

## 2022-09-07 LAB — CBC WITH DIFFERENTIAL/PLATELET
Basophils Absolute: 0.1 10*3/uL (ref 0.0–0.1)
Basophils Relative: 1.9 % (ref 0.0–3.0)
Eosinophils Absolute: 0.2 10*3/uL (ref 0.0–0.7)
Eosinophils Relative: 2.4 % (ref 0.0–5.0)
HCT: 45.2 % (ref 39.0–52.0)
Hemoglobin: 15.2 g/dL (ref 13.0–17.0)
Lymphocytes Relative: 46.9 % — ABNORMAL HIGH (ref 12.0–46.0)
Lymphs Abs: 3 10*3/uL (ref 0.7–4.0)
MCHC: 33.6 g/dL (ref 30.0–36.0)
MCV: 97.2 fl (ref 78.0–100.0)
Monocytes Absolute: 0.6 10*3/uL (ref 0.1–1.0)
Monocytes Relative: 8.8 % (ref 3.0–12.0)
Neutro Abs: 2.6 10*3/uL (ref 1.4–7.7)
Neutrophils Relative %: 40 % — ABNORMAL LOW (ref 43.0–77.0)
Platelets: 180 10*3/uL (ref 150.0–400.0)
RBC: 4.65 Mil/uL (ref 4.22–5.81)
RDW: 13 % (ref 11.5–15.5)
WBC: 6.4 10*3/uL (ref 4.0–10.5)

## 2022-09-07 LAB — HEMOGLOBIN A1C: Hgb A1c MFr Bld: 5.8 % (ref 4.6–6.5)

## 2022-09-07 LAB — TSH: TSH: 8.38 u[IU]/mL — ABNORMAL HIGH (ref 0.35–5.50)

## 2022-09-07 LAB — LIPASE: Lipase: 20 U/L (ref 11.0–59.0)

## 2022-09-07 MED ORDER — DIVALPROEX SODIUM ER 250 MG PO TB24: ORAL_TABLET | ORAL | 3 refills | Status: DC

## 2022-09-07 NOTE — Addendum Note (Signed)
Addended by: Van Clines on: 09/07/2022 09:44 AM   Modules accepted: Orders

## 2022-09-14 ENCOUNTER — Encounter: Payer: Self-pay | Admitting: Family Medicine

## 2022-09-14 ENCOUNTER — Other Ambulatory Visit: Payer: Medicare Other

## 2022-09-14 ENCOUNTER — Ambulatory Visit (INDEPENDENT_AMBULATORY_CARE_PROVIDER_SITE_OTHER): Payer: Medicare Other | Admitting: Family Medicine

## 2022-09-14 VITALS — BP 126/84 | HR 60 | Temp 97.8°F | Ht 73.0 in | Wt 232.5 lb

## 2022-09-14 DIAGNOSIS — G3109 Other frontotemporal dementia: Secondary | ICD-10-CM | POA: Diagnosis not present

## 2022-09-14 DIAGNOSIS — E039 Hypothyroidism, unspecified: Secondary | ICD-10-CM | POA: Diagnosis not present

## 2022-09-14 DIAGNOSIS — R350 Frequency of micturition: Secondary | ICD-10-CM

## 2022-09-14 DIAGNOSIS — Z8042 Family history of malignant neoplasm of prostate: Secondary | ICD-10-CM

## 2022-09-14 DIAGNOSIS — R110 Nausea: Secondary | ICD-10-CM | POA: Diagnosis not present

## 2022-09-14 DIAGNOSIS — Z125 Encounter for screening for malignant neoplasm of prostate: Secondary | ICD-10-CM

## 2022-09-14 DIAGNOSIS — E7800 Pure hypercholesterolemia, unspecified: Secondary | ICD-10-CM

## 2022-09-14 DIAGNOSIS — F0280 Dementia in other diseases classified elsewhere without behavioral disturbance: Secondary | ICD-10-CM

## 2022-09-14 MED ORDER — LEVOTHYROXINE SODIUM 100 MCG PO TABS: 100.0000 ug | ORAL_TABLET | Freq: Every day | ORAL | 3 refills | Status: DC

## 2022-09-14 NOTE — Assessment & Plan Note (Signed)
Lab Results  Component Value Date   PSA 0.95 10/21/2021    Fam hx -father and brother  Will set up f/u with urology

## 2022-09-14 NOTE — Assessment & Plan Note (Signed)
Want him to touch base with urology for this and urinary frequency Lab Results  Component Value Date   PSA 0.95 10/21/2021     Referral done Neg ua last visit

## 2022-09-14 NOTE — Assessment & Plan Note (Signed)
No change in medicines or treatment from neuro

## 2022-09-14 NOTE — Assessment & Plan Note (Signed)
Will plan lipids with next labs in 4-6 wk Disc goals for lipids and reasons to control them Rev last labs with pt Rev low sat fat diet in detail

## 2022-09-14 NOTE — Assessment & Plan Note (Signed)
Lab Results  Component Value Date   TSH 8.38 (H) 09/07/2022   Now dosing correctly and regularly  Will increase levothyroxine to 100 mcg daily  Instructed to alert if side effects  Re check tsh 4-6 wk This may help with cognition a bit in setting of dementia

## 2022-09-14 NOTE — Assessment & Plan Note (Signed)
Much improved with pepcid 20 mg bid Will continue this  Encouraged strongly to avoid nsaids unless abs necessary   Instructed to update if this worsens again   Better appetite  Wt noted up today/eating more regularly

## 2022-09-14 NOTE — Progress Notes (Signed)
Subjective:    Patient ID: Miguel Mendez, male    DOB: 03-27-1956, 67 y.o.   MRN: [id number]  HPI Pt presents for f/u of hypothyroidism , chronic nausea and urinary symptoms  Wt Readings from Last 3 Encounters:  09/14/22 232 lb 8 oz (105.5 kg)  08/19/22 225 lb (102.1 kg)  07/18/22 226 lb 6.4 oz (102.7 kg)   30.67 kg/m  Vitals:   09/14/22 0913  BP: 126/84  Pulse: 60  Temp: 97.8 F (36.6 C)  SpO2: 95%   Hypothyroidism Lab Results  Component Value Date   TSH 8.38 (H) 09/07/2022   Turns out-he was not taking med regularly  Has done well since last visit  Has been fatigued    Nausea Last time we added pepcid 20 mg bid This is improved - almost gone now  Appetite is ok / eating regularly     Urinary frequency  Not bothersome to him right now/ would rather not take medication   Last ua was clear  Last saw urology 2020  Lab Results  Component Value Date   PSA 0.95 10/21/2021   Father had prostate cancer / also his brother      Chemistry      Component Value Date/Time   NA 141 09/07/2022 0838   K 3.7 09/07/2022 0838   CL 102 09/07/2022 0838   CO2 31 09/07/2022 0838   BUN 7 09/07/2022 0838   CREATININE 1.26 09/07/2022 0838      Component Value Date/Time   CALCIUM 9.0 09/07/2022 0838   ALKPHOS 57 09/07/2022 0838   AST 18 09/07/2022 0838   ALT 19 09/07/2022 0838   BILITOT 0.9 09/07/2022 0838     GFR 59.7  Lab Results  Component Value Date   WBC 6.4 09/07/2022   HGB 15.2 09/07/2022   HCT 45.2 09/07/2022   MCV 97.2 09/07/2022   PLT 180.0 09/07/2022   Lab Results  Component Value Date   HGBA1C 5.8 09/07/2022   Stable Glucose 93  Patient Active Problem List   Diagnosis Date Noted   Urinary frequency 08/19/2022   Nausea 08/19/2022   Routine general medical examination at a health care facility 10/21/2021   Prostate cancer screening 10/21/2021   Abscess of neck 05/26/2021   Frontotemporal dementia (HCC) 12/19/2019   Colon cancer  screening 10/04/2016   Elevated glucose level 10/04/2016   Obesity (BMI 30.0-34.9) 10/04/2016   Hypothyroidism 11/21/2013   Family history of prostate cancer 10/30/2012   OBSTRUCTIVE SLEEP APNEA 11/08/2007   Low testosterone 10/30/2007   Hyperlipidemia 10/30/2007   Past Medical History:  Diagnosis Date   Acute upper respiratory infections of unspecified site    Family history of malignant neoplasm of prostate    Hyperlipidemia    Other malaise and fatigue    Other testicular hypofunction    Sleep apnea    CPAP use   Thyroid disease    Tobacco use disorder    Past Surgical History:  Procedure Laterality Date   FINGER FRACTURE SURGERY Left    middle   KNEE SURGERY Right    SHOULDER SURGERY Bilateral    Social History   Tobacco Use   Smoking status: Some Days    Types: Cigars, Cigarettes   Smokeless tobacco: Never   Tobacco comments:    quit 03/2013  Vaping Use   Vaping Use: Never used  Substance Use Topics   Alcohol use: Yes    Comment: Rare   Drug use: No  Family History  Problem Relation Age of Onset   Prostate cancer Father    Coronary artery disease Father    Bladder Cancer Father    Prostate cancer Brother    Coronary artery disease Maternal Grandfather    Coronary artery disease Paternal Grandfather        in old age   Diabetes Cousin    Diabetes Son    Colon cancer Paternal Uncle 19   Allergies  Allergen Reactions   Bee Venom Swelling   Current Outpatient Medications on File Prior to Visit  Medication Sig Dispense Refill   diphenhydramine-acetaminophen (TYLENOL PM) 25-500 MG TABS tablet Take 1 tablet by mouth at bedtime as needed.     divalproex (DEPAKOTE ER) 250 MG 24 hr tablet Take 2 tablet in AM, 2 tablets in PM 360 tablet 3   donepezil (ARICEPT) 10 MG tablet Take 1 tablet daily 90 tablet 3   escitalopram (LEXAPRO) 20 MG tablet Take 1 and 1/2 tablets daily 135 tablet 3   famotidine (PEPCID) 20 MG tablet Take 1 tablet (20 mg total) by mouth  daily. In the evening 30 tablet 1   Glucosamine-Chondroitin (OSTEO BI-FLEX REGULAR STRENGTH PO) Take by mouth. 2 a day     Melatonin 10 MG TABS Take by mouth.     memantine (NAMENDA) 10 MG tablet Take 1 tablet twice a day 180 tablet 3   Multiple Vitamin (MULTIVITAMIN) tablet Take 1 tablet by mouth daily.     Naproxen Sodium (ALEVE PO) Take by mouth daily.     NON FORMULARY CPAP     No current facility-administered medications on file prior to visit.     Review of Systems  Constitutional:  Positive for fatigue. Negative for activity change, appetite change, fever and unexpected weight change.  HENT:  Negative for congestion, rhinorrhea, sore throat and trouble swallowing.   Eyes:  Negative for pain, redness, itching and visual disturbance.  Respiratory:  Negative for cough, chest tightness, shortness of breath and wheezing.   Cardiovascular:  Negative for chest pain and palpitations.  Gastrointestinal:  Negative for abdominal pain, blood in stool, constipation, diarrhea and nausea.       Nausea is much improved   Endocrine: Negative for cold intolerance, heat intolerance, polydipsia and polyuria.  Genitourinary:  Negative for difficulty urinating, dysuria, frequency and urgency.  Musculoskeletal:  Negative for arthralgias, joint swelling and myalgias.  Skin:  Negative for pallor and rash.  Neurological:  Negative for dizziness, tremors, weakness, numbness and headaches.  Hematological:  Negative for adenopathy. Does not bruise/bleed easily.  Psychiatric/Behavioral:  Positive for decreased concentration. Negative for dysphoric mood. The patient is not nervous/anxious.        Objective:   Physical Exam Constitutional:      General: He is not in acute distress.    Appearance: Normal appearance. He is well-developed. He is obese. He is not ill-appearing or diaphoretic.  HENT:     Head: Normocephalic and atraumatic.  Eyes:     Conjunctiva/sclera: Conjunctivae normal.     Pupils:  Pupils are equal, round, and reactive to light.  Neck:     Thyroid: No thyromegaly.     Vascular: No carotid bruit or JVD.  Cardiovascular:     Rate and Rhythm: Normal rate and regular rhythm.     Heart sounds: Normal heart sounds.     No gallop.  Pulmonary:     Effort: Pulmonary effort is normal. No respiratory distress.     Breath sounds:  Normal breath sounds. No wheezing or rales.  Abdominal:     General: There is no distension or abdominal bruit.     Palpations: Abdomen is soft.  Musculoskeletal:     Cervical back: Normal range of motion and neck supple.     Right lower leg: No edema.     Left lower leg: No edema.  Lymphadenopathy:     Cervical: No cervical adenopathy.  Skin:    General: Skin is warm and dry.     Coloration: Skin is not pale.     Findings: No rash.  Neurological:     Mental Status: He is alert.     Coordination: Coordination normal.     Deep Tendon Reflexes: Reflexes are normal and symmetric. Reflexes normal.  Psychiatric:        Mood and Affect: Mood normal. Affect is blunt.        Cognition and Memory: He exhibits impaired recent memory.     Comments: Baseline dementia  Blunted affect            Assessment & Plan:   Problem List Items Addressed This Visit       Endocrine   Hypothyroidism    Lab Results  Component Value Date   TSH 8.38 (H) 09/07/2022  Now dosing correctly and regularly  Will increase levothyroxine to 100 mcg daily  Instructed to alert if side effects  Re check tsh 4-6 wk This may help with cognition a bit in setting of dementia       Relevant Medications   levothyroxine (SYNTHROID) 100 MCG tablet   Other Relevant Orders   TSH     Nervous and Auditory   Frontotemporal dementia (HCC)    No change in medicines or treatment from neuro         Other   Family history of prostate cancer    Want him to touch base with urology for this and urinary frequency Lab Results  Component Value Date   PSA 0.95 10/21/2021     Referral done Neg ua last visit       Relevant Orders   Ambulatory referral to Urology   Hyperlipidemia    Will plan lipids with next labs in 4-6 wk Disc goals for lipids and reasons to control them Rev last labs with pt Rev low sat fat diet in detail        Relevant Orders   Lipid panel   Nausea - Primary    Much improved with pepcid 20 mg bid Will continue this  Encouraged strongly to avoid nsaids unless abs necessary   Instructed to update if this worsens again   Better appetite  Wt noted up today/eating more regularly       Prostate cancer screening    Lab Results  Component Value Date   PSA 0.95 10/21/2021   Fam hx -father and brother  Will set up f/u with urology      Relevant Orders   Ambulatory referral to Urology   Urinary frequency    Worse  Neg ua last time Reviewed labs-no diabetes  Pt is not bothered enough to take medicine Suspect BPH   Ref to urology for this and prostate screening discussion (strong fam hx)      Relevant Orders   Ambulatory referral to Urology

## 2022-09-14 NOTE — Assessment & Plan Note (Addendum)
Worse  Neg ua last time Reviewed labs-no diabetes  Pt is not bothered enough to take medicine Suspect BPH   Ref to urology for this and prostate screening discussion (strong fam hx)

## 2022-09-14 NOTE — Patient Instructions (Addendum)
Continue pepcid for nausea  Avoid nsaids (ibuprofen/ aleve) unless absolutely necessary - then if you take on a full stomach   Go up on thyroid medicine to 100 mcg daily (you are on 88 mcg now)  Continue to take it regularly  We will re check TSH in 4-6 weeks   I want you to see urology to check in re: prostate health  I put the referral in  Please let us know if you don't hear in 1-2 weeks     Take care of yourself

## 2022-09-20 ENCOUNTER — Encounter: Payer: Self-pay | Admitting: *Deleted

## 2022-09-21 MED ORDER — QUETIAPINE FUMARATE 25 MG PO TABS
ORAL_TABLET | ORAL | 3 refills | Status: DC
Start: 2022-09-21 — End: 2022-11-07

## 2022-09-21 NOTE — Addendum Note (Signed)
Addended by: Van Clines on: 09/21/2022 12:07 PM   Modules accepted: Orders

## 2022-10-16 ENCOUNTER — Other Ambulatory Visit: Payer: Self-pay | Admitting: Family Medicine

## 2022-10-19 ENCOUNTER — Other Ambulatory Visit: Payer: Medicare Other

## 2022-11-04 ENCOUNTER — Encounter: Payer: Self-pay | Admitting: Neurology

## 2022-11-07 MED ORDER — QUETIAPINE FUMARATE 25 MG PO TABS: ORAL_TABLET | ORAL | 3 refills | Status: DC

## 2022-12-21 ENCOUNTER — Ambulatory Visit: Payer: Medicare Other

## 2023-01-17 ENCOUNTER — Encounter: Payer: Self-pay | Admitting: Neurology

## 2023-01-18 ENCOUNTER — Encounter: Payer: Self-pay | Admitting: Neurology

## 2023-01-18 ENCOUNTER — Ambulatory Visit (INDEPENDENT_AMBULATORY_CARE_PROVIDER_SITE_OTHER): Payer: Medicare Other | Admitting: Neurology

## 2023-01-18 VITALS — BP 121/77 | HR 74 | Ht 73.0 in | Wt 248.2 lb

## 2023-01-18 DIAGNOSIS — F02818 Dementia in other diseases classified elsewhere, unspecified severity, with other behavioral disturbance: Secondary | ICD-10-CM

## 2023-01-18 DIAGNOSIS — G3109 Other frontotemporal dementia: Secondary | ICD-10-CM

## 2023-01-18 DIAGNOSIS — F419 Anxiety disorder, unspecified: Secondary | ICD-10-CM | POA: Diagnosis not present

## 2023-01-18 MED ORDER — ESCITALOPRAM OXALATE 20 MG PO TABS: ORAL_TABLET | ORAL | 3 refills | Status: DC

## 2023-01-18 MED ORDER — GABAPENTIN 300 MG PO CAPS: ORAL_CAPSULE | ORAL | 3 refills | Status: DC

## 2023-01-18 MED ORDER — DONEPEZIL HCL 10 MG PO TABS: ORAL_TABLET | ORAL | 3 refills | Status: DC

## 2023-01-18 MED ORDER — MEMANTINE HCL 10 MG PO TABS: ORAL_TABLET | ORAL | 3 refills | Status: DC

## 2023-01-18 MED ORDER — QUETIAPINE FUMARATE 25 MG PO TABS: ORAL_TABLET | ORAL | 3 refills | Status: DC

## 2023-01-18 MED ORDER — DIVALPROEX SODIUM ER 250 MG PO TB24: ORAL_TABLET | ORAL | 3 refills | Status: DC

## 2023-01-18 NOTE — Patient Instructions (Signed)
Good to see you.  Start Gabapentin 300mg : take 1 capsule every evening. Update me in a month, we can increase dose as tolerated  2. Continue all your medications  3. Continue close supervision  4. Follow-up in 4 months, call for any changes
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
## 2023-01-31 ENCOUNTER — Telehealth: Payer: Self-pay | Admitting: Neurology

## 2023-01-31 NOTE — Telephone Encounter (Signed)
Pt's wife called in stating she needs a letter stating she is the pt's only caregiver. She is trying to get out of jury duty.

## 2023-02-01 ENCOUNTER — Encounter: Payer: Self-pay | Admitting: Neurology

## 2023-02-02 ENCOUNTER — Encounter: Payer: Self-pay | Admitting: Neurology

## 2023-02-02 NOTE — Telephone Encounter (Signed)
Reply sent on MyChart, letter done.

## 2023-02-07 ENCOUNTER — Encounter: Payer: Self-pay | Admitting: Neurology

## 2023-03-03 MED ORDER — QUETIAPINE FUMARATE 25 MG PO TABS: ORAL_TABLET | ORAL | 3 refills | Status: DC

## 2023-03-03 MED ORDER — GABAPENTIN 300 MG PO CAPS: ORAL_CAPSULE | ORAL | 3 refills | Status: DC

## 2023-03-22 ENCOUNTER — Other Ambulatory Visit: Payer: Self-pay | Admitting: Family Medicine

## 2023-04-01 ENCOUNTER — Encounter: Payer: Self-pay | Admitting: Neurology

## 2023-05-16 ENCOUNTER — Telehealth: Payer: Self-pay | Admitting: Family Medicine

## 2023-05-16 NOTE — Telephone Encounter (Signed)
Copied from CRM [id number]. Topic: General - Other >> May 16, 2023 11:41 AM Corin V wrote: Reason for CRM: Patient's wife called to advise that patient passed away this past 06-13-23 so that Dr. Milinda Antis was aware.

## 2023-05-16 NOTE — Telephone Encounter (Signed)
Thanks- I was unaware  So sorry to hear that and best to his family  I think Dr Reece Agar sees his wife , will cc to him  Let me know if there is anything I can help with

## 2023-05-16 NOTE — Telephone Encounter (Signed)
Will route to PCP as a FYI and also Joellen to update pt's chart

## 2023-05-16 NOTE — Telephone Encounter (Signed)
Chart updated

## 2023-05-19 ENCOUNTER — Ambulatory Visit: Payer: Medicare Other | Admitting: Neurology

## 2023-05-27 DEATH — deceased
